# Patient Record
Sex: Male | Born: 1990
Health system: Southern US, Community
[De-identification: ages and names within clinical notes are randomized; demographics above are authoritative.]

## PROBLEM LIST (undated history)

## (undated) DIAGNOSIS — J45909 Unspecified asthma, uncomplicated: Secondary | ICD-10-CM

## (undated) DIAGNOSIS — F909 Attention-deficit hyperactivity disorder, unspecified type: Secondary | ICD-10-CM

## (undated) DIAGNOSIS — T7840XA Allergy, unspecified, initial encounter: Secondary | ICD-10-CM

## (undated) DIAGNOSIS — D6851 Activated protein C resistance: Secondary | ICD-10-CM

## (undated) DIAGNOSIS — F32A Depression, unspecified: Secondary | ICD-10-CM

## (undated) DIAGNOSIS — F329 Major depressive disorder, single episode, unspecified: Secondary | ICD-10-CM

## (undated) DIAGNOSIS — F419 Anxiety disorder, unspecified: Secondary | ICD-10-CM

## (undated) HISTORY — DX: Depression, unspecified: F32.A

## (undated) HISTORY — DX: Unspecified asthma, uncomplicated: J45.909

## (undated) HISTORY — DX: Attention-deficit hyperactivity disorder, unspecified type: F90.9

## (undated) HISTORY — DX: Activated protein C resistance: D68.51

## (undated) HISTORY — DX: Major depressive disorder, single episode, unspecified: F32.9

## (undated) HISTORY — DX: Anxiety disorder, unspecified: F41.9

## (undated) HISTORY — PX: OTHER SURGICAL HISTORY: SHX169

## (undated) HISTORY — PX: COLONOSCOPY: SHX174

## (undated) HISTORY — DX: Allergy, unspecified, initial encounter: T78.40XA

---
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
## 2011-10-06 ENCOUNTER — Ambulatory Visit (INDEPENDENT_AMBULATORY_CARE_PROVIDER_SITE_OTHER): Payer: BC Managed Care – PPO

## 2011-10-06 DIAGNOSIS — F411 Generalized anxiety disorder: Secondary | ICD-10-CM

## 2011-10-06 DIAGNOSIS — F988 Other specified behavioral and emotional disorders with onset usually occurring in childhood and adolescence: Secondary | ICD-10-CM

## 2011-12-08 ENCOUNTER — Other Ambulatory Visit: Payer: Self-pay

## 2011-12-08 NOTE — Telephone Encounter (Signed)
Ritalin refill  Call mom [phone]

## 2011-12-09 MED ORDER — METHYLPHENIDATE HCL 20 MG PO TABS: 20.0000 mg | ORAL_TABLET | Freq: Three times a day (TID) | ORAL | Status: DC

## 2011-12-09 NOTE — Telephone Encounter (Signed)
Pt last seen for this 10/06/11. Dr Laney Pastor wrote x 2 at [facility]

## 2011-12-10 NOTE — Telephone Encounter (Signed)
Rx was written by Chelle and p/up by pt

## 2012-01-19 ENCOUNTER — Telehealth: Payer: Self-pay

## 2012-01-19 MED ORDER — METHYLPHENIDATE HCL 20 MG PO TABS: 20.0000 mg | ORAL_TABLET | Freq: Three times a day (TID) | ORAL | Status: DC

## 2012-01-19 NOTE — Telephone Encounter (Signed)
Done. Ready for pick up.  Benjamin [name]

## 2012-01-19 NOTE — Telephone Encounter (Signed)
PTS MOTHER CALLED TO REQUEST PTS RITALIN REFILLED  BEST: [phone]

## 2012-01-20 NOTE — Telephone Encounter (Signed)
LMOM that req ready for p/up

## 2012-02-16 ENCOUNTER — Telehealth: Payer: Self-pay

## 2012-02-16 NOTE — Telephone Encounter (Signed)
NEEDS RIDALIN REFILL FOR SON [place], [phone]

## 2012-02-17 MED ORDER — METHYLPHENIDATE HCL 20 MG PO TABS: 20.0000 mg | ORAL_TABLET | Freq: Three times a day (TID) | ORAL | Status: DC

## 2012-02-17 NOTE — Telephone Encounter (Signed)
LMOM THAT RX IS READY FOR PICKUP

## 2012-02-17 NOTE — Telephone Encounter (Signed)
CHART IS AT NURSES STATION FOR REVIEW  [id number]

## 2012-02-17 NOTE — Telephone Encounter (Signed)
Done and printed

## 2012-04-12 ENCOUNTER — Telehealth: Payer: Self-pay

## 2012-04-12 MED ORDER — METHYLPHENIDATE HCL 20 MG PO TABS: 20.0000 mg | ORAL_TABLET | Freq: Three times a day (TID) | ORAL | Status: DC

## 2012-04-12 NOTE — Telephone Encounter (Signed)
[place] IN NEED OF HIS RITALIN. PLEASE CALL [phone]

## 2012-04-12 NOTE — Telephone Encounter (Signed)
Chart is pulled and at the nurses station in the phone message pile.

## 2012-04-12 NOTE — Telephone Encounter (Signed)
Rx done and ready for pickup. Will need office visit for further refills.

## 2012-04-12 NOTE — Telephone Encounter (Signed)
Please pull paper chart.

## 2012-04-13 NOTE — Telephone Encounter (Signed)
Notified mother that Rx is ready and pt needs OV for add'l RFs. Mother agreed.

## 2012-05-25 ENCOUNTER — Ambulatory Visit (INDEPENDENT_AMBULATORY_CARE_PROVIDER_SITE_OTHER): Payer: BC Managed Care – PPO | Admitting: Internal Medicine

## 2012-05-25 VITALS — BP 122/86 | HR 82 | Temp 98.3°F | Resp 14 | Ht 69.5 in | Wt 172.0 lb

## 2012-05-25 DIAGNOSIS — F988 Other specified behavioral and emotional disorders with onset usually occurring in childhood and adolescence: Secondary | ICD-10-CM | POA: Insufficient documentation

## 2012-05-25 MED ORDER — METHYLPHENIDATE HCL 20 MG PO TABS: 20.0000 mg | ORAL_TABLET | Freq: Three times a day (TID) | ORAL | Status: DC

## 2012-05-25 MED ORDER — ALBUTEROL SULFATE HFA 108 (90 BASE) MCG/ACT IN AERS: 2.0000 | INHALATION_SPRAY | Freq: Four times a day (QID) | RESPIRATORY_TRACT | Status: DC | PRN

## 2012-05-25 NOTE — Progress Notes (Addendum)
  Subjective:    Patient ID: Benjamin [name], male    DOB: [birth year], 21 y.o.   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 This next year which is their first year of the team/Division I program/very difficult to find time for academics Has been there all summer taking courses Training at this level as produced significant weight gain and muscle without loss of speed He has no current problems and would like to use Ritalin again for this year/has been off for the summer  Majors political science/he hopes to work in the [employer]/plans to take [place]   Review of Systems Negative for any new symptoms/During the year he was identified as having exercise-induced bronchospasm He was treated with dulera and proair for breakthrough problems Successfully    Objective:   Physical Exam Vital signs stable Pupils equal round reactive to light and accommodation Heart regular without murmur or click Neurological intact Psychiatric stable       Assessment & Plan:  Problem #1 attention deficit disorder Meds ordered this encounter  Medications  . albuterol (PROVENTIL HFA;VENTOLIN HFA) 108 (90 BASE) MCG/ACT inhaler    Sig: Inhale 2 puffs into the lungs every 6 (six) hours as needed for wheezing.    Dispense:  1 Inhaler    Refill:  0  . methylphenidate (RITALIN) 20 MG tablet    Sig: Take 1 tablet (20 mg total) by mouth 3 (three) times daily.    Dispense:  90 tablet    Refill:  0  . methylphenidate (RITALIN) 20 MG tablet    Sig: Take 1 tablet (20 mg total) by mouth 3 (three) times daily. For 05/25/12 or after    Dispense:  90 tablet    Refill:  0  . methylphenidate (RITALIN) 20 MG tablet      Sig: Take 1 tablet (20 mg total) by mouth 3 (three) times daily. For 07/25/12    Dispense:  90 tablet    Refill:  0  May call in 3 months for another 3 prescriptions and followup in 6 months

## 2012-06-03 ENCOUNTER — Other Ambulatory Visit: Payer: Self-pay | Admitting: Internal Medicine

## 2012-06-03 NOTE — Telephone Encounter (Signed)
Deny, rx just refilled on 05/25/2012

## 2012-06-11 ENCOUNTER — Telehealth: Payer: Self-pay

## 2012-06-11 MED ORDER — ALBUTEROL SULFATE HFA 108 (90 BASE) MCG/ACT IN AERS: 2.0000 | INHALATION_SPRAY | Freq: Four times a day (QID) | RESPIRATORY_TRACT | Status: DC | PRN

## 2012-06-11 NOTE — Telephone Encounter (Signed)
Called in [place], mother notified

## 2012-06-11 NOTE — Telephone Encounter (Signed)
PATIENT'S MOTHER CALLED IN REGARD TO [name] INHALER.  DR. Laney Pastor HAD WRITTEN A PRESCRIPTION FOR ONE, BUT HE ENDED UP HAVING TO LEAVE THE INHALER WITH HIS COACH.  THEY WANT TO KNOW IF THEY CAN GET ANOTHER PRESCRIPTION FOR THE SAME INHALER SO THAT HE CAN HAVE ONE WITH HIM AT ALL TIMES.  COULD WE PLEASE CALL THIS INTO [facility], [place] - PHARMACY #.  [place] [phone]

## 2012-06-11 NOTE — Telephone Encounter (Signed)
Rx printed to be faxed to pharmacy.

## 2012-09-06 ENCOUNTER — Telehealth: Payer: Self-pay

## 2012-09-06 DIAGNOSIS — F988 Other specified behavioral and emotional disorders with onset usually occurring in childhood and adolescence: Secondary | ICD-10-CM

## 2012-09-06 NOTE — Telephone Encounter (Signed)
[place] IN NEED OF HIS RITALIN. PLEASE CALL [phone] WHEN READY

## 2012-09-06 NOTE — Telephone Encounter (Signed)
Patient due for follow up in Feb, have pended the Rx. Please advise.

## 2012-09-07 MED ORDER — METHYLPHENIDATE HCL 20 MG PO TABS: 20.0000 mg | ORAL_TABLET | Freq: Three times a day (TID) | ORAL | Status: DC

## 2012-09-07 NOTE — Telephone Encounter (Signed)
[place] for refills  Meds ordered this encounter  Medications  . methylphenidate (RITALIN) 20 MG tablet    Sig: Take 1 tablet (20 mg total) by mouth 3 (three) times daily. To be filled after 10/07/12    Dispense:  90 tablet    Refill:  0    Due for follow up in Feb 2014  . methylphenidate (RITALIN) 20 MG tablet    Sig: Take 1 tablet (20 mg total) by mouth 3 (three) times daily.    Dispense:  90 tablet    Refill:  0

## 2012-09-07 NOTE — Telephone Encounter (Signed)
Rx printed, but not signed will alert patient when Rx ready for pick up,

## 2012-09-20 ENCOUNTER — Telehealth: Payer: Self-pay

## 2012-09-20 NOTE — Telephone Encounter (Signed)
We can not call in Ritalin please advise what would be best course of action. Amy

## 2012-09-20 NOTE — Telephone Encounter (Signed)
MOTHER CALLED BACK AND SAID TO DISREGARD.  SHE FOUND A FRIEND WHO WILL BE TRAVELING TO THAT AREA, SO SHE IS GOING TO SEND THE MEDICINE BY THEM.

## 2012-09-20 NOTE — Telephone Encounter (Signed)
PT'S MOTHER CALLED - PT OF [employer] - HE TAKES RITALIN AND BECAUSE HE HAS HAD PROBLEMS WITH STUDENTS STEALING IT AT [employer], SHE HAS BEEN SENDING HIM A WEEK'S SUPPLY AT A TIME BY MAIL.  SHE SAYS THE LAST PACKAGE WAS LOST IN THE MAIL AND THAT HE IS TAKING EXAMS THIS WEEK.  HIS OTHER SCRIPT CAN NOT BE FILLED UNTIL January.  MOTHER WANTS TO KNOW IF WE CAN CALL IN 6 PILLS, JUST ENOUGH TO GET HIM THROUGH EXAMS.  HE WILL USE THE [facility] AT [facility] IN [place], [place] NUMBER IS [phone]

## 2012-09-27 ENCOUNTER — Ambulatory Visit: Payer: BC Managed Care – PPO | Admitting: Physician Assistant

## 2012-10-11 ENCOUNTER — Telehealth: Payer: Self-pay

## 2012-10-11 NOTE — Telephone Encounter (Signed)
PT IN NEED OF HIS RITALIN. PLEASE CALL [phone] WHEN READY

## 2012-10-12 NOTE — Telephone Encounter (Signed)
According to the chart he has a prescription written for January for Ritalin/please call to see what the story is

## 2012-10-12 NOTE — Telephone Encounter (Signed)
Left message for him to call me back.  

## 2012-10-15 NOTE — Telephone Encounter (Signed)
Patient has not called back, hopefully he has found his rx.

## 2012-10-20 ENCOUNTER — Other Ambulatory Visit: Payer: Self-pay | Admitting: Internal Medicine

## 2012-11-16 ENCOUNTER — Telehealth: Payer: Self-pay

## 2012-11-16 DIAGNOSIS — F988 Other specified behavioral and emotional disorders with onset usually occurring in childhood and adolescence: Secondary | ICD-10-CM

## 2012-11-16 MED ORDER — METHYLPHENIDATE HCL 20 MG PO TABS: 20.0000 mg | ORAL_TABLET | Freq: Three times a day (TID) | ORAL | Status: DC

## 2012-11-16 NOTE — Telephone Encounter (Signed)
PT IN NEED OF HIS RITALIN. PLEASE CALL [phone] WHEN READY FOR P/U

## 2012-11-16 NOTE — Telephone Encounter (Signed)
Is stable F/u after semester Meds ordered this encounter  Medications  . methylphenidate (RITALIN) 20 MG tablet    Sig: Take 1 tablet (20 mg total) by mouth 3 (three) times daily.    Dispense:  90 tablet    Refill:  0  . methylphenidate (RITALIN) 20 MG tablet    Sig: Take 1 tablet (20 mg total) by mouth 3 (three) times daily. To be filled after 01/14/13    Dispense:  90 tablet    Refill:  0  . methylphenidate (RITALIN) 20 MG tablet    Sig: Take 1 tablet (20 mg total) by mouth 3 (three) times daily. For 12/14/12 or after    Dispense:  90 tablet    Refill:  0

## 2012-11-16 NOTE — Telephone Encounter (Signed)
What is his follow up plan? He is due for office visit. I spoke to his mother and he probably will not be able to come in until May, when he is done with classes.

## 2012-11-17 NOTE — Telephone Encounter (Signed)
Called patients mother to advise Rx ready for pick up

## 2013-02-20 ENCOUNTER — Ambulatory Visit: Payer: BC Managed Care – PPO | Admitting: Internal Medicine

## 2013-02-20 VITALS — BP 120/76 | HR 72 | Temp 98.2°F | Resp 16 | Ht 69.5 in | Wt 170.0 lb

## 2013-02-20 DIAGNOSIS — F411 Generalized anxiety disorder: Secondary | ICD-10-CM | POA: Insufficient documentation

## 2013-02-20 DIAGNOSIS — F988 Other specified behavioral and emotional disorders with onset usually occurring in childhood and adolescence: Secondary | ICD-10-CM

## 2013-02-20 MED ORDER — METHYLPHENIDATE HCL 20 MG PO TABS: 20.0000 mg | ORAL_TABLET | Freq: Three times a day (TID) | ORAL | Status: DC

## 2013-02-20 MED ORDER — SERTRALINE HCL 100 MG PO TABS: 100.0000 mg | ORAL_TABLET | Freq: Every day | ORAL | Status: DC

## 2013-02-20 NOTE — Progress Notes (Signed)
  Subjective:    Patient ID: Benjamin [name], male    DOB: 1991/01/01, 22 y.o.   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 Grades were a struggle/academic probation at 1 point but survived to pass Restarted Celexa/Lexapro in December or January for mild depression and the anxiety Anxiety often despite this/at least 2 panic attacks/most anxiety centered around academics No trouble with sleep Jerlyn Ly counseling since exams Mother with strong history of anxiety disorder Much better now that school is out Will attend summer school however  Ritalin still working well without exacerbating anxiety   Review of Systems Had lots of trouble with asthma especially in the colder months/has been working with an Horticulturist, commercial in the sports medicine team physician in [place]/currently on dulera plus proair      Objective:   Physical Exam BP 120/76  Pulse 72  Temp(Src) 98.2 F (36.8 C) (Oral)  Resp 16  Ht 5' 9.5" (1.765 m)  Wt 170 lb (77.111 kg)  BMI 24.75 kg/m2 HEENT clear Heart regular Neurological intact Mood stable/affect appropriate       Assessment & Plan:  ADD (attention deficit disorder) - Plan:methylphenidate (RITALIN) 20 MG tablet tid x 3  May call in 3 months for another three-month refill  Anxiety state, unspecified - Plan: sertraline (ZOLOFT) 100 MG tablet  2 call in one month with a progress report so we can adjust his medication by phone  Overcoming anxiety for Dummies  Continue counseling

## 2013-03-17 ENCOUNTER — Telehealth: Payer: Self-pay

## 2013-03-17 NOTE — Telephone Encounter (Signed)
Dr.Doolittle, Pt's mother is dropping off a form for you to complete, the form is located in your box. [phone]

## 2013-03-19 NOTE — Telephone Encounter (Signed)
Please send message back when completed.

## 2013-03-22 NOTE — Telephone Encounter (Signed)
Please call mom I can't find this form /think I lost it She could have the [employer] compliance officer for his school send [place] another request or she could send me another copy of the form if she has it

## 2013-03-23 NOTE — Telephone Encounter (Signed)
Was able to find one online, it is in your box. Sign when you are able. Amy

## 2013-03-25 ENCOUNTER — Encounter: Payer: Self-pay | Admitting: Internal Medicine

## 2013-05-20 ENCOUNTER — Ambulatory Visit: Payer: BC Managed Care – PPO | Admitting: Internal Medicine

## 2013-05-20 VITALS — BP 120/70 | HR 91 | Temp 97.5°F | Resp 18 | Ht 70.0 in | Wt 182.0 lb

## 2013-05-20 DIAGNOSIS — L03119 Cellulitis of unspecified part of limb: Secondary | ICD-10-CM

## 2013-05-20 DIAGNOSIS — L02619 Cutaneous abscess of unspecified foot: Secondary | ICD-10-CM

## 2013-05-20 DIAGNOSIS — G4700 Insomnia, unspecified: Secondary | ICD-10-CM

## 2013-05-20 DIAGNOSIS — F411 Generalized anxiety disorder: Secondary | ICD-10-CM

## 2013-05-20 MED ORDER — DOXYCYCLINE HYCLATE 100 MG PO TABS: 100.0000 mg | ORAL_TABLET | Freq: Two times a day (BID) | ORAL | Status: DC

## 2013-05-20 MED ORDER — CLONAZEPAM 0.5 MG PO TABS: 0.5000 mg | ORAL_TABLET | Freq: Every evening | ORAL | Status: DC | PRN

## 2013-05-20 MED ORDER — SERTRALINE HCL 100 MG PO TABS: 150.0000 mg | ORAL_TABLET | Freq: Every day | ORAL | Status: DC

## 2013-05-20 NOTE — Progress Notes (Signed)
  Subjective:    Patient ID: Benjamin [name], male    DOB: 25-May-1991, 22 y.o.   MRN: [id number]  HPI   3-4hrs to fall asleep then wakes early-- Dr Edison Pace---   Review of Systems     Objective:   Physical Exam        Assessment & Plan:

## 2013-07-26 ENCOUNTER — Ambulatory Visit: Payer: BC Managed Care – PPO | Admitting: Internal Medicine

## 2013-07-26 VITALS — BP 116/72 | HR 77 | Temp 97.9°F | Resp 16 | Ht 70.0 in | Wt 190.0 lb

## 2013-07-26 DIAGNOSIS — F411 Generalized anxiety disorder: Secondary | ICD-10-CM

## 2013-07-26 DIAGNOSIS — F988 Other specified behavioral and emotional disorders with onset usually occurring in childhood and adolescence: Secondary | ICD-10-CM

## 2013-07-26 MED ORDER — METHYLPHENIDATE HCL 20 MG PO TABS: 20.0000 mg | ORAL_TABLET | Freq: Three times a day (TID) | ORAL | Status: DC

## 2013-07-26 MED ORDER — SERTRALINE HCL 100 MG PO TABS: 150.0000 mg | ORAL_TABLET | Freq: Every day | ORAL | Status: DC

## 2013-07-26 NOTE — Progress Notes (Signed)
Subjective:    Patient ID: Benjamin [name], male    DOB: 21-May-1991, 21 y.o.   MRN: [id number]  HPI This chart was scribed for [facility],  by Lovena Le Day, Scribe. This patient was seen in room 1 and the patient's care was started at 4:14 PM.  HPI Comments: Benjamin [name] is a 22 y.o. male who presents to the [facility] for a medication update/refill of ritalin. He reports no side affects from his ritalin medicine or zoloft medicine and states has not needed to use needed to use sleeping medicine recently. He states has had no recent illnesses. He states does not feel any need to change the plan for his current medicines. He has been using half dosage of his ritalin before working out or going to volunteer and states that he usually takes x1 per day to help him stay organized and focused.  He states his relationship at home with his parents has been well and have not had any major conflicts recently. He states that recently he has been volunteering often and working out everyday. He is planning to take classes in town this school year at [employer] and next fall start classes at [employer]. And play lacrosse  Past Medical History  Diagnosis Date  . Anxiety   . Depression   . Allergy     -  ADD  History reviewed. No pertinent past surgical history.  Family History  Problem Relation Age of Onset  . Hypertension Mother   . Hypertension Father     History   Social History  . Marital Status: Single    Spouse Name: N/A    Number of Children: N/A  . Years of Education: N/A   Occupational History  . Not on file.   Social History Main Topics  . Smoking status: Never Smoker   . Smokeless tobacco: Not on file  . Alcohol Use: Not on file  . Drug Use: Not on file  . Sexual Activity: Not on file   Other Topics Concern  . Not on file   Social History Narrative  . No narrative on file    Allergies  Allergen Reactions  . Penicillins     Patient  Active Problem List   Diagnosis Date Noted  . Generalized anxiety disorder 02/20/2013  . ADD (attention deficit disorder) 05/25/2012     Review of Systems  Constitutional: Negative for fever and chills.  Psychiatric/Behavioral: Negative for behavioral problems and sleep disturbance.   BP 116/72  Pulse 77  Temp(Src) 97.9 F (36.6 C) (Oral)  Resp 16  Ht 5' 10"  (1.778 m)  Wt 190 lb (86.183 kg)  BMI 27.26 kg/m2  SpO2 99%     Objective:   Physical Exam  Nursing note and vitals reviewed. Constitutional: He is oriented to person, place, and time. He appears well-developed and well-nourished. No distress.  HENT:  Head: Normocephalic and atraumatic.  Neck: Neck supple. No tracheal deviation present.  Cardiovascular: Normal rate.   Pulmonary/Chest: Effort normal. No respiratory distress.  Musculoskeletal: Normal range of motion.  Neurological: He is alert and oriented to person, place, and time.  Skin: Skin is warm and dry.  Psychiatric: He has a normal mood and affect. His behavior is normal.         Assessment & Plan:  ADD (attention deficit disorder) - Plan: methylphenidate (RITALIN) 20 MG tablet, methylphenidate (RITALIN) 20 MG tablet, methylphenidate (RITALIN) 20 MG tablet  Anxiety state, unspecified - Plan:  sertraline (ZOLOFT) 100 MG tablet  Meds ordered this encounter  Medications  . methylphenidate (RITALIN) 20 MG tablet    Sig: Take 1 tablet (20 mg total) by mouth 3 (three) times daily.    Dispense:  90 tablet    Refill:  0  . methylphenidate (RITALIN) 20 MG tablet    Sig: Take 1 tablet (20 mg total) by mouth 3 (three) times daily. After 10/06/13    Dispense:  90 tablet    Refill:  0  . methylphenidate (RITALIN) 20 MG tablet    Sig: Take 1 tablet (20 mg total) by mouth 3 (three) times daily. After 08/30/13    Dispense:  90 tablet    Refill:  0  . sertraline (ZOLOFT) 100 MG tablet    Sig: Take 1.5 tablets (150 mg total) by mouth daily. #135 3 refills     Dispense:  135    Refill:  3   May refill til transition to college in august

## 2013-09-26 ENCOUNTER — Telehealth: Payer: Self-pay

## 2013-09-26 NOTE — Telephone Encounter (Signed)
Pharmacy advised, okay to fill early. He did get this early last month also.

## 2013-09-26 NOTE — Telephone Encounter (Signed)
Pharmacy states patient is getting this early every month for the past 4 months. They are very concerned about early fills. I advised pharmacy I see no documentation he has gotten this early.

## 2013-09-26 NOTE — Telephone Encounter (Signed)
We should make sure next rx goes back in line with original 30 d interval

## 2013-09-26 NOTE — Telephone Encounter (Signed)
[phone]  Prescription for Ritalin is not to be filled until 10/06/13.  Patient is going out of town and would like it filled today..   [phone]

## 2013-09-26 NOTE — Telephone Encounter (Signed)
Is it okay for early refill due to holidays?

## 2013-09-26 NOTE — Telephone Encounter (Signed)
Called him, advised, called pharmacy advised.

## 2013-09-26 NOTE — Telephone Encounter (Signed)
Just fine

## 2013-09-27 NOTE — Telephone Encounter (Signed)
Thanks left message at pharmacy.

## 2013-10-29 ENCOUNTER — Ambulatory Visit: Payer: BC Managed Care – PPO | Admitting: Internal Medicine

## 2013-10-29 VITALS — BP 110/68 | HR 99 | Temp 98.0°F | Resp 16 | Ht 69.5 in | Wt 193.0 lb

## 2013-10-29 DIAGNOSIS — F988 Other specified behavioral and emotional disorders with onset usually occurring in childhood and adolescence: Secondary | ICD-10-CM

## 2013-10-29 MED ORDER — METHYLPHENIDATE HCL 20 MG PO TABS: 20.0000 mg | ORAL_TABLET | Freq: Three times a day (TID) | ORAL | Status: DC

## 2013-10-29 NOTE — Progress Notes (Signed)
   Subjective:    Patient ID: Benjamin [name], male    DOB: [birth year], 23 y.o.   MRN: [id number] This chart was scribed for Tami Lin, MD by Vernell Barrier, Medical Scribe. This patient's care was started at 9:55 AM.  HPI HPI Comments: Benjamin [name] is a 23 y.o. male who presents to the [facility] for a medication refill.  Patient Active Problem List   Diagnosis Date Noted  . Generalized anxiety disorder 02/20/2013  . ADD (attention deficit disorder) 05/25/2012   Pt is not back in school; states he has a lack of motivation to go back right now. Is currently working as a Physiological scientist at [employer]. States medications are working fine. Pt is still taking 150 mg Zoloft and 20 mg Ritalin. Pt reports no other recent issues or injury at today's visit. Still scheduled to return to [employer] next fall to play lacrosse Continues to respond well to Zoloft 150 mg daily Review of Systems Noncontributory Objective:  Physical Exam  Nursing note and vitals reviewed. Constitutional: He is oriented to person, place, and time. He appears well-developed and well-nourished. No distress.  HENT:  Head: Normocephalic and atraumatic.  Eyes: EOM are normal.  Neck: Neck supple. No tracheal deviation present.  Cardiovascular: Normal rate.   Pulmonary/Chest: Effort normal. No respiratory distress.  Musculoskeletal: Normal range of motion.  Neurological: He is alert and oriented to person, place, and time.  Skin: Skin is warm and dry.  Psychiatric: He has a normal mood and affect. His behavior is normal.   Assessment & Plan:  I have completed the patient encounter in its entirety as documented by the scribe, with editing by me where necessary. Adair Lemar P. Laney Pastor, M.D. ADD (attention deficit disorder) - Plan: methylphenidate (RITALIN) 20 MG tablet, methylphenidate (RITALIN) 20 MG tablet, methylphenidate (RITALIN) 20 MG tablet  generalized anxiety disorder Meds  ordered this encounter  Medications  . methylphenidate (RITALIN) 20 MG tablet    Sig: Take 1 tablet (20 mg total) by mouth 3 (three) times daily. For 60 days after signing date    Dispense:  90 tablet    Refill:  0  . methylphenidate (RITALIN) 20 MG tablet    Sig: Take 1 tablet (20 mg total) by mouth 3 (three) times daily.    Dispense:  90 tablet    Refill:  0  . methylphenidate (RITALIN) 20 MG tablet    Sig: Take 1 tablet (20 mg total) by mouth 3 (three) times daily. for 30 days after signing date    Dispense:  90 tablet    Refill:  0   has Zoloft Follow-through 6 months

## 2014-01-26 ENCOUNTER — Telehealth: Payer: Self-pay

## 2014-01-26 DIAGNOSIS — F988 Other specified behavioral and emotional disorders with onset usually occurring in childhood and adolescence: Secondary | ICD-10-CM

## 2014-01-26 NOTE — Telephone Encounter (Signed)
Patient called requesting a refill on Ritalin 20 mg [facility] [address].

## 2014-01-29 MED ORDER — METHYLPHENIDATE HCL 20 MG PO TABS: 20.0000 mg | ORAL_TABLET | Freq: Three times a day (TID) | ORAL | Status: DC

## 2014-01-29 NOTE — Telephone Encounter (Signed)
Meds ordered this encounter  Medications  . methylphenidate (RITALIN) 20 MG tablet    Sig: Take 1 tablet (20 mg total) by mouth 3 (three) times daily.    Dispense:  90 tablet    Refill:  0  . methylphenidate (RITALIN) 20 MG tablet    Sig: Take 1 tablet (20 mg total) by mouth 3 (three) times daily. For 60 days after signing date    Dispense:  90 tablet    Refill:  0  . methylphenidate (RITALIN) 20 MG tablet    Sig: Take 1 tablet (20 mg total) by mouth 3 (three) times daily. for 30 days after signing date    Dispense:  90 tablet    Refill:  0

## 2014-01-30 NOTE — Telephone Encounter (Signed)
LMVM that medication would be ready for pick up today after 4pm per Dr. Ninfa Meeker comment.

## 2014-05-02 ENCOUNTER — Telehealth: Payer: Self-pay

## 2014-05-02 DIAGNOSIS — F988 Other specified behavioral and emotional disorders with onset usually occurring in childhood and adolescence: Secondary | ICD-10-CM

## 2014-05-02 MED ORDER — METHYLPHENIDATE HCL 20 MG PO TABS: 20.0000 mg | ORAL_TABLET | Freq: Three times a day (TID) | ORAL | Status: DC

## 2014-05-02 NOTE — Telephone Encounter (Signed)
lmom for pt, rx ready for pick up at 102

## 2014-05-02 NOTE — Telephone Encounter (Signed)
Please advise 

## 2014-05-02 NOTE — Telephone Encounter (Signed)
Meds ordered this encounter  Medications  . methylphenidate (RITALIN) 20 MG tablet    Sig: Take 1 tablet (20 mg total) by mouth 3 (three) times daily. For 60 days after signing date    Dispense:  90 tablet    Refill:  0  . methylphenidate (RITALIN) 20 MG tablet    Sig: Take 1 tablet (20 mg total) by mouth 3 (three) times daily.    Dispense:  90 tablet    Refill:  0  . methylphenidate (RITALIN) 20 MG tablet    Sig: Take 1 tablet (20 mg total) by mouth 3 (three) times daily. for 30 days after signing date    Dispense:  90 tablet    Refill:  0

## 2014-05-02 NOTE — Telephone Encounter (Signed)
Patient is requesting methylphenidate (RITALIN) 20 MG tablet   [phone]

## 2014-06-06 ENCOUNTER — Telehealth: Payer: Self-pay

## 2014-06-06 DIAGNOSIS — F988 Other specified behavioral and emotional disorders with onset usually occurring in childhood and adolescence: Secondary | ICD-10-CM

## 2014-06-06 NOTE — Telephone Encounter (Signed)
Patient called and states he lost 2 of his prescriptions (20 mg ritalin). Patient wants to know if he can have new prescriptions. Please return call and advise. CB # [phone]

## 2014-06-07 MED ORDER — METHYLPHENIDATE HCL 20 MG PO TABS: 20.0000 mg | ORAL_TABLET | Freq: Three times a day (TID) | ORAL | Status: DC

## 2014-06-07 NOTE — Telephone Encounter (Signed)
Meds ordered this encounter  Medications  . methylphenidate (RITALIN) 20 MG tablet    Sig: Take 1 tablet (20 mg total) by mouth 3 (three) times daily.    Dispense:  90 tablet    Refill:  0  . methylphenidate (RITALIN) 20 MG tablet    Sig: Take 1 tablet (20 mg total) by mouth 3 (three) times daily. for 30 days after signing date    Dispense:  90 tablet    Refill:  0

## 2014-06-08 NOTE — Telephone Encounter (Signed)
Rx in Dr Laney Pastor box to be signed.

## 2014-06-09 NOTE — Telephone Encounter (Signed)
Lm to advise pt.

## 2014-08-28 ENCOUNTER — Telehealth: Payer: Self-pay

## 2014-08-28 NOTE — Telephone Encounter (Signed)
Pt requesting adderall refill for 3 mths   Best phone [phone]

## 2014-08-28 NOTE — Telephone Encounter (Signed)
Yes/needs f/u--?home from school Friday?? Or could add him on this Wednesday appt

## 2014-08-28 NOTE — Telephone Encounter (Signed)
It looks like pt takes Ritalin, not Adderall, but he has not been in for check up recently. Dr Laney Pastor do you want to give any RFs?

## 2014-08-29 NOTE — Telephone Encounter (Signed)
Notified pt of need for f/up and transferred to 104 to sch appt for tomorrow. He will not be in town on Fri.

## 2014-08-30 ENCOUNTER — Encounter: Payer: Self-pay | Admitting: Internal Medicine

## 2014-08-30 ENCOUNTER — Ambulatory Visit (INDEPENDENT_AMBULATORY_CARE_PROVIDER_SITE_OTHER): Payer: BC Managed Care – PPO | Admitting: Internal Medicine

## 2014-08-30 VITALS — BP 126/83 | HR 79 | Temp 98.0°F | Resp 16 | Ht 70.0 in | Wt 199.0 lb

## 2014-08-30 DIAGNOSIS — J45909 Unspecified asthma, uncomplicated: Secondary | ICD-10-CM

## 2014-08-30 DIAGNOSIS — F988 Other specified behavioral and emotional disorders with onset usually occurring in childhood and adolescence: Secondary | ICD-10-CM

## 2014-08-30 DIAGNOSIS — F411 Generalized anxiety disorder: Secondary | ICD-10-CM

## 2014-08-30 DIAGNOSIS — F909 Attention-deficit hyperactivity disorder, unspecified type: Secondary | ICD-10-CM

## 2014-08-30 MED ORDER — METHYLPHENIDATE HCL 20 MG PO TABS: 20.0000 mg | ORAL_TABLET | Freq: Three times a day (TID) | ORAL | Status: DC

## 2014-08-30 MED ORDER — ALBUTEROL SULFATE HFA 108 (90 BASE) MCG/ACT IN AERS: 2.0000 | INHALATION_SPRAY | Freq: Four times a day (QID) | RESPIRATORY_TRACT | Status: DC | PRN

## 2014-08-30 NOTE — Progress Notes (Signed)
Subjective:    Patient ID: Benjamin [name], male    DOB: 19-Feb-1991, 23 y.o.   MRN: [id number]  HPI Patient presents for medication refills of Ritalin and Albuterol. Has been well in the interval. Ritalin still working and well tolerated. 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 Only side effect is loss of appetite for 1 hr follow med administration, but returns and is able to eat.  Only taking 100 mg of Zoloft and works at that dose. No refills needed at this time.  Would like to have an inhaler on hand. No current sx, however, will be smoking the [place] for Thanksgiving and would like for just in case.  Plans on returning to school Spring 2016 at [employer] for exercise science.  Review of Systems  Constitutional: Positive for appetite change (secondary to medication). Negative for fever, diaphoresis, activity change and fatigue.  Respiratory: Negative for shortness of breath and wheezing.   Cardiovascular: Negative for chest pain and palpitations.  Gastrointestinal: Negative for abdominal pain.  Neurological: Negative for dizziness, numbness and headaches.  Psychiatric/Behavioral: Negative for sleep disturbance.       Objective:   Physical Exam  Constitutional: He is oriented to person, place, and time. He appears well-developed and well-nourished. No distress.  Blood pressure 126/83, pulse 79, temperature 98 F (36.7 C), resp. rate 16, height 5' 10"  (1.778 m), weight 199 lb (90.266 kg), SpO2 97 %.   HENT:  Head: Normocephalic and atraumatic.  Right Ear: External ear normal.  Left Ear: External ear normal.  Nose: Nose normal.  Mouth/Throat: Oropharynx is clear and moist. No oropharyngeal exudate.  Eyes: Conjunctivae are normal. Pupils are equal, round, and reactive to light. Right eye exhibits no discharge. Left eye exhibits no discharge. No scleral icterus.  Neck: Neck supple. No thyromegaly present.  Cardiovascular: Normal rate, regular rhythm and normal heart sounds.   Exam reveals no gallop and no friction rub.   No murmur heard. Pulmonary/Chest: Breath sounds normal. He has no wheezes. He has no rales.  Abdominal: Soft. Bowel sounds are normal. There is no tenderness.  Lymphadenopathy:    He has no cervical adenopathy.  Neurological: He is alert and oriented to person, place, and time.  Skin: Skin is warm and dry. No rash noted. He is not diaphoretic. No erythema. No pallor.       Assessment & Plan:  1. Asthma, chronic, unspecified asthma severity, uncomplicated Controlled. - albuterol (PROVENTIL HFA;VENTOLIN HFA) 108 (90 BASE) MCG/ACT inhaler; Inhale 2 puffs into the lungs every 6 (six) hours as needed for wheezing.  Dispense: 1 Inhaler; Refill: 3  2. ADD (attention deficit disorder) Controlled. Refill Ritalin 20 mg.  3. Generalized anxiety disorder Controlled. No longer taking Klonopin.   Alveta Heimlich PA-C  [facility] 08/30/2014 1:39 PM Meds ordered this encounter  Medications  . albuterol (PROVENTIL HFA;VENTOLIN HFA) 108 (90 BASE) MCG/ACT inhaler    Sig: Inhale 2 puffs into the lungs every 6 (six) hours as needed for wheezing.    Dispense:  1 Inhaler    Refill:  3    Order Specific Question:  Supervising Provider    Answer:  Wardell Honour [[id number]]  . methylphenidate (RITALIN) 20 MG tablet    Sig: Take 1 tablet (20 mg total) by mouth 3 (three) times daily. for 30 days after signing date    Dispense:  90 tablet    Refill:  0  . methylphenidate (RITALIN)  20 MG tablet    Sig: Take 1 tablet (20 mg total) by mouth 3 (three) times daily. For 60 days after signing date    Dispense:  90 tablet    Refill:  0  . methylphenidate (RITALIN) 20 MG tablet    Sig: Take 1 tablet (20 mg total) by mouth 3 (three) times daily.    Dispense:  90 tablet    Refill:  0   I have participated in the care of this patient with the Advanced Practice Provider and agree with Diagnosis and Plan as  documented. Robert P. Laney Pastor, M.D.

## 2015-03-21 ENCOUNTER — Other Ambulatory Visit: Payer: Self-pay

## 2015-03-21 DIAGNOSIS — F988 Other specified behavioral and emotional disorders with onset usually occurring in childhood and adolescence: Secondary | ICD-10-CM

## 2015-03-21 NOTE — Telephone Encounter (Signed)
Needs refills on methylphenidate (RITALIN) 20 MG tablet Wants to know when patient is due for an OV for refills - Still one year???  [phone]  Joaquim Lai (mom)

## 2015-03-23 MED ORDER — METHYLPHENIDATE HCL 20 MG PO TABS: 20.0000 mg | ORAL_TABLET | Freq: Three times a day (TID) | ORAL | Status: DC

## 2015-03-23 NOTE — Telephone Encounter (Signed)
Tishira not in until Tues. Can someone help?

## 2015-03-23 NOTE — Telephone Encounter (Signed)
Tishira R Brewington, PA-C saw him with me at 104---she can write for 2 months and I can se him in aug before fall semester

## 2015-03-23 NOTE — Telephone Encounter (Signed)
Filled and printed 2 months worth.

## 2015-03-26 NOTE — Telephone Encounter (Signed)
Notified mother Rxs ready and need for f/up in Aug.

## 2015-05-23 ENCOUNTER — Encounter: Payer: Self-pay | Admitting: Internal Medicine

## 2015-05-23 ENCOUNTER — Ambulatory Visit (INDEPENDENT_AMBULATORY_CARE_PROVIDER_SITE_OTHER): Payer: BLUE CROSS/BLUE SHIELD | Admitting: Internal Medicine

## 2015-05-23 VITALS — BP 135/86 | HR 73 | Temp 97.9°F | Resp 16 | Ht 70.0 in | Wt 199.6 lb

## 2015-05-23 DIAGNOSIS — F988 Other specified behavioral and emotional disorders with onset usually occurring in childhood and adolescence: Secondary | ICD-10-CM

## 2015-05-23 DIAGNOSIS — F909 Attention-deficit hyperactivity disorder, unspecified type: Secondary | ICD-10-CM | POA: Diagnosis not present

## 2015-05-23 MED ORDER — METHYLPHENIDATE HCL 20 MG PO TABS: 20.0000 mg | ORAL_TABLET | Freq: Three times a day (TID) | ORAL | Status: DC

## 2015-05-23 MED ORDER — METHYLPHENIDATE HCL 20 MG PO TABS
20.0000 mg | ORAL_TABLET | Freq: Three times a day (TID) | ORAL | Status: DC
Start: 2015-05-23 — End: 2015-08-06

## 2015-05-24 NOTE — Progress Notes (Signed)
   Subjective:    Patient ID: Benjamin [name], male    DOB: May 23, 1991, 24 y.o.   MRN: [id number]  HPI follow-up Patient Active Problem List   Diagnosis Date Noted  . ADD (attention deficit disorder) 05/25/2012    Priority: Medium  . Generalized anxiety disorder 02/20/2013   Outpatient Encounter Prescriptions in past  Medication Sig  . albuterol (PROVENTIL HFA;VENTOLIN HFA) 108 (90 BASE) MCG/ACT inhaler Inhale 2 puffs into the lungs every 6 (six) hours as needed for wheezing.  . clonazePAM (KLONOPIN) 0.5 MG tablet Take 1 tablet (0.5 mg total) by mouth at bedtime as needed for anxiety.  . methylphenidate (RITALIN) 20 MG tablet Take 1 tablet (20 mg total) by mouth 3 (three) times daily.  . sertraline (ZOLOFT) 100 MG tablet Take 1.5 tablets (150 mg total) by mouth daily. (Patient not taking: Reported on 05/23/2015)   No facility-administered encounter medications on file as of 05/23/2015.   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 He has stopped using Zoloft. He is stopped Klonopin. He has not required much Ritalin. He has been challenged by his parents divorce. Father cheated. He has been support from mother. He is about return to school at [employer] for general requirements. He has however uncertain of his future, what will make him happy. This was a problem for him with his attempts at college in past.    Review of Systems Noncontributory    Objective:   Physical Exam  Constitutional: He is oriented to person, place, and time. He appears well-developed and well-nourished. No distress.  HENT:  Head: Normocephalic and atraumatic.  Eyes: Pupils are equal, round, and reactive to light.  Neck: Normal range of motion.  Cardiovascular: Normal rate and regular rhythm.   Pulmonary/Chest: Effort normal. No respiratory distress.  Musculoskeletal: Normal range of motion.  Neurological: He is  alert and oriented to person, place, and time.  Skin: Skin is warm and dry.  Psychiatric: He has a normal mood and affect. His behavior is normal.  Nursing note and vitals reviewed.  BP 135/86 mmHg  Pulse 73  Temp(Src) 97.9 F (36.6 C) (Oral)  Resp 16  Ht 5' 10"  (1.778 m)  Wt 199 lb 9.6 oz (90.538 kg)  BMI 28.64 kg/m2  SpO2 100%        Assessment & Plan:  ADD Adolescent adjustment reaction prolonged  We had a long discussion about finding purpose in life, and about alternatives to school as a way to explore his interests. If he resumes school he will need Ritalin restrictions have been written. He can follow-up in 6 months. Meds ordered this encounter  Medications  . methylphenidate (RITALIN) 20 MG tablet    Sig: Take 1 tablet (20 mg total) by mouth 3 (three) times daily. for 30 days after signing date    Dispense:  90 tablet    Refill:  0  . methylphenidate (RITALIN) 20 MG tablet    Sig: Take 1 tablet (20 mg total) by mouth 3 (three) times daily.    Dispense:  90 tablet    Refill:  0  . methylphenidate (RITALIN) 20 MG tablet    Sig: Take 1 tablet (20 mg total) by mouth 3 (three) times daily. For 60 days after signing date    Dispense:  90 tablet    Refill:  0

## 2015-07-12 ENCOUNTER — Ambulatory Visit: Payer: Self-pay | Admitting: Family Medicine

## 2015-08-06 ENCOUNTER — Encounter: Payer: Self-pay | Admitting: Family Medicine

## 2015-08-06 ENCOUNTER — Ambulatory Visit (INDEPENDENT_AMBULATORY_CARE_PROVIDER_SITE_OTHER): Payer: BLUE CROSS/BLUE SHIELD | Admitting: Family Medicine

## 2015-08-06 VITALS — BP 134/86 | HR 90 | Temp 98.3°F | Wt 203.0 lb

## 2015-08-06 DIAGNOSIS — F909 Attention-deficit hyperactivity disorder, unspecified type: Secondary | ICD-10-CM | POA: Diagnosis not present

## 2015-08-06 DIAGNOSIS — F988 Other specified behavioral and emotional disorders with onset usually occurring in childhood and adolescence: Secondary | ICD-10-CM

## 2015-08-06 DIAGNOSIS — Z23 Encounter for immunization: Secondary | ICD-10-CM

## 2015-08-06 DIAGNOSIS — F411 Generalized anxiety disorder: Secondary | ICD-10-CM

## 2015-08-06 DIAGNOSIS — J45909 Unspecified asthma, uncomplicated: Secondary | ICD-10-CM | POA: Insufficient documentation

## 2015-08-06 DIAGNOSIS — J4520 Mild intermittent asthma, uncomplicated: Secondary | ICD-10-CM | POA: Diagnosis not present

## 2015-08-06 NOTE — Progress Notes (Addendum)
Benjamin Reddish, MD Phone: [phone]  Subjective:  Patient presents today to establish care. Chief complaint-noted.   See problem oriented charting  The following were reviewed and entered/updated in epic: Past Medical History  Diagnosis Date  . Anxiety   . Depression   . Allergy   . Asthma     only before sports   Patient Active Problem List   Diagnosis Date Noted  . Asthma   . Generalized anxiety disorder 02/20/2013  . ADD (attention deficit disorder) 05/25/2012   Past Surgical History  Procedure Laterality Date  . None      Family History  Problem Relation Age of Onset  . Hypertension Mother   . Hypertension Father   . Alcoholism Father   . Alcoholism Paternal Grandfather     Medications- reviewed and updated Current Outpatient Prescriptions  Medication Sig Dispense Refill  . albuterol (PROVENTIL HFA;VENTOLIN HFA) 108 (90 BASE) MCG/ACT inhaler Inhale 2 puffs into the lungs every 6 (six) hours as needed for wheezing. 1 Inhaler 3  . methylphenidate (RITALIN) 20 MG tablet Take 1 tablet (20 mg total) by mouth 3 (three) times daily. For 60 days after signing date 90 tablet 0   No current facility-administered medications for this visit.    Allergies-reviewed and updated Allergies  Allergen Reactions  . Penicillins     Social History   Social History  . Marital Status: Single    Spouse Name: N/A  . Number of Children: N/A  . Years of Education: N/A   Social History Main Topics  . Smoking status: Never Smoker   . Smokeless tobacco: None  . Alcohol Use: 8.4 oz/week    14 Standard drinks or equivalent per week  . Drug Use: No  . Sexual Activity: Not Asked   Other Topics Concern  . None   Social History Narrative   Single. Not dating.       Full time personal trainer at the club   Going back to [place] until GPA up then [employer] is the plan   Wants to study exercise science      Hobbies: a lot of working out- 7 days a week, avid Museum/gallery conservator, wants to play lacrosse again    ROS--See HPI below , otherwise full ROS was completed and negative except as noted above  Objective: BP 134/86 mmHg  Pulse 90  Temp(Src) 98.3 F (36.8 C)  Wt 203 lb (92.08 kg) Gen: NAD, resting comfortably HEENT: Mucous membranes are moist. Oropharynx normal. TM normal. Eyes: sclera and lids normal, PERRLA Neck: no thyromegaly, no cervical lymphadenopathy CV: RRR no murmurs rubs or gallops Lungs: CTAB no crackles, wheeze, rhonchi Abdomen: soft/nontender/nondistended/normal bowel sounds. No rebound or guarding.  No GU exam Ext: no edema Skin: warm, dry Neuro: 5/5 strength in upper and lower extremities, normal gait, normal reflexes Psych: slightly anxious appearing  Assessment/Plan:  Generalized anxiety disorder S:Stopped taking zoloft in July. Despite divorce of mom and Dad (father cheated) and rough divorce- symptoms have not worsened and patient in fact states they have been controlled. History of alcoholism- used to drink mor ein past but currently 2 a night or 14 a week. Wants to cut to once anight or out.  ROS- no SI/HI A/P: continue to monitor, warning signs given for immediate return. With family history of alcoholism- I agreed cutting alcohol out would be reasonable. Want to avoid treating depression with alcohol potentially.    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 When not competing at high levels- does fine without albuterol but has on hand. No weekly use of albuterol A/P: exercise induced per patient report so likely simply mild intermittent asthma. Continue prn albuterol.    ADD (attention deficit disorder) S:ADD diagnosed sophomore year in HS. Does not remember provider but has papers at home which he will drop off. Has been well controlled with Ritalin 36m TID.  A/P: Treated by Dr. DLaney Pastorfor 3 years- willing to give 3 months but will need records to be in this office  before next 3 months   Patient has had sex in past but was protected. He declines STD screening.  CPE with baseline labs within next 1-2 years  Return in 6 months from next Ritalin refill (he has some left but will call when running out)   Check in with patient about how things are going with dealing with divorce and how plans are forming for future- still planning [employer] then [employer]?  Return precautions advised.   Orders Placed This Encounter  Procedures  . Flu Vaccine QUAD 36+ mos IM  . Tdap vaccine greater than or equal to 7yo IM

## 2015-08-06 NOTE — Patient Instructions (Addendum)
Flu shot received today.  Please drop off a copy of the information where you were diagnosed with ADD  We will provide by phone your next refill when we have that then we will see you in 6 months  You can sign up for a physical in a year and we will get baseline bloodwork on you at that time  Great to meet you and look forward to working with you!

## 2015-08-07 NOTE — Assessment & Plan Note (Signed)
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 When not competing at high levels- does fine without albuterol but has on hand. No weekly use of albuterol A/P: exercise induced per patient report so likely simply mild intermittent asthma. Continue prn albuterol.

## 2015-08-07 NOTE — Assessment & Plan Note (Signed)
S:Stopped taking zoloft in July. Despite divorce of mom and Dad (father cheated) and rough divorce- symptoms have not worsened and patient in fact states they have been controlled. History of alcoholism- used to drink mor ein past but currently 2 a night or 14 a week. Wants to cut to once anight or out.  ROS- no SI/HI A/P: continue to monitor, warning signs given for immediate return. With family history of alcoholism- I agreed cutting alcohol out would be reasonable. Want to avoid treating depression with alcohol potentially.

## 2015-08-07 NOTE — Assessment & Plan Note (Signed)
S:ADD diagnosed sophomore year in HS. Does not remember provider but has papers at home which he will drop off. Has been well controlled with Ritalin 17m TID.  A/P: Treated by Dr. DLaney Pastorfor 3 years- willing to give 3 months but will need records to be in this office before next 3 months

## 2015-09-03 ENCOUNTER — Encounter: Payer: Self-pay | Admitting: Internal Medicine

## 2015-10-17 ENCOUNTER — Telehealth: Payer: Self-pay

## 2015-10-17 NOTE — Telephone Encounter (Signed)
Pt called and LM that his ritalin needs a PA for new ins. PA approved for ritalin 20 mg TID, case # [id number]. Pt notified and pharm faxed.

## 2015-10-18 NOTE — Telephone Encounter (Signed)
Approval date through 10/16/16.

## 2016-01-03 ENCOUNTER — Encounter: Payer: Self-pay | Admitting: Family Medicine

## 2016-01-03 ENCOUNTER — Ambulatory Visit (INDEPENDENT_AMBULATORY_CARE_PROVIDER_SITE_OTHER): Payer: 59 | Admitting: Family Medicine

## 2016-01-03 VITALS — BP 128/88 | HR 93 | Temp 97.8°F | Wt 200.0 lb

## 2016-01-03 DIAGNOSIS — F411 Generalized anxiety disorder: Secondary | ICD-10-CM | POA: Diagnosis not present

## 2016-01-03 DIAGNOSIS — F909 Attention-deficit hyperactivity disorder, unspecified type: Secondary | ICD-10-CM

## 2016-01-03 DIAGNOSIS — F988 Other specified behavioral and emotional disorders with onset usually occurring in childhood and adolescence: Secondary | ICD-10-CM

## 2016-01-03 MED ORDER — METHYLPHENIDATE HCL 20 MG PO TABS: 20.0000 mg | ORAL_TABLET | Freq: Three times a day (TID) | ORAL | Status: DC | PRN

## 2016-01-03 NOTE — Patient Instructions (Addendum)
We will provide by phone your next refill for ADD. Call us before you run out in 3 months.

## 2016-01-03 NOTE — Assessment & Plan Note (Signed)
S: Symptoms controlled on ritalin TID. He tries to take minimal efffective dose so at times is able to take 1/4th or hafl a pill, at other sneeds full pill. Went through 180 pills or less in last 6 months A/P: refilled medication as doing well. #180 total given- likely to last 6 months but discussed I am willing to refill especially if restarts school where he generally needs more. Initial BP was elevated but normal on repeat so no indication to stop ritalin.

## 2016-01-03 NOTE — Assessment & Plan Note (Signed)
Still struggling with parents divorce. Yet to talk to father. Still drinking about 2 drinks a night with no recent increase. Anxiety generally controlled- states far worse in school. Ritalin does not seem to worsen anxiety significantly but has in past- will monitor. Counseled patient during visit.

## 2016-01-03 NOTE — Progress Notes (Signed)
Garret Reddish, MD  Subjective:  Benjamin [name] is a 25 y.o. year old very pleasant male patient who presents for/with See problem oriented charting ROS- no palpitations or chest pain, no unintentional weight loss or nausea  Past Medical History-  Patient Active Problem List   Diagnosis Date Noted  . Asthma   . Generalized anxiety disorder 02/20/2013  . ADD (attention deficit disorder) 05/25/2012    Medications- reviewed and updated Current Outpatient Prescriptions  Medication Sig Dispense Refill  . methylphenidate (RITALIN) 20 MG tablet Take 1 tablet (20 mg total) by mouth 3 (three) times daily as needed. May fill today 90 tablet 0  . albuterol (PROVENTIL HFA;VENTOLIN HFA) 108 (90 BASE) MCG/ACT inhaler Inhale 2 puffs into the lungs every 6 (six) hours as needed for wheezing. 1 Inhaler 3   Objective: BP 128/88 mmHg  Pulse 93  Temp(Src) 97.8 F (36.6 C)  Wt 200 lb (90.719 kg) Gen: NAD, resting comfortably, slightly anxious appearing CV: RRR no murmurs rubs or gallops Lungs: CTAB no crackles, wheeze, rhonchi Ext: no edema Skin: warm, dry  Assessment/Plan:  ADD (attention deficit disorder) S: Symptoms controlled on ritalin TID. He tries to take minimal efffective dose so at times is able to take 1/4th or hafl a pill, at other sneeds full pill. Went through 180 pills or less in last 6 months A/P: refilled medication as doing well. #180 total given- likely to last 6 months but discussed I am willing to refill especially if restarts school where he generally needs more. Initial BP was elevated but normal on repeat so no indication to stop ritalin.    Generalized anxiety disorder Still struggling with parents divorce. Yet to talk to father. Still drinking about 2 drinks a night with no recent increase. Anxiety generally controlled- states far worse in school. Ritalin does not seem to worsen anxiety significantly but has in past- will monitor. Counseled patient during visit.     Return in about 6 months (around 07/05/2016) for physical. Return precautions advised.   Meds ordered this encounter  Medications  . methylphenidate (RITALIN) 20 MG tablet    Sig: Take 1 tablet (20 mg total) by mouth 3 (three) times daily as needed. May fill today    Dispense:  90 tablet    Refill:  0  . methylphenidate (RITALIN) 20 MG tablet    Sig: Take 1 tablet (20 mg total) by mouth 3 (three) times daily as needed. May fill in 1 month    Dispense:  90 tablet    Refill:  0   The duration of face-to-face time during this visit was 15 minutes. Greater than 50% of this time was spent in counseling, explanation of diagnosis, planning of further management, and/or coordination of care.

## 2016-05-14 ENCOUNTER — Encounter: Payer: Self-pay | Admitting: Family Medicine

## 2016-05-14 ENCOUNTER — Ambulatory Visit (INDEPENDENT_AMBULATORY_CARE_PROVIDER_SITE_OTHER): Payer: 59 | Admitting: Family Medicine

## 2016-05-14 DIAGNOSIS — F411 Generalized anxiety disorder: Secondary | ICD-10-CM

## 2016-05-14 MED ORDER — ESCITALOPRAM OXALATE 10 MG PO TABS: 10.0000 mg | ORAL_TABLET | Freq: Every day | ORAL | 5 refills | Status: DC

## 2016-05-14 NOTE — Patient Instructions (Signed)
Start lexapro 81m  Follow up 4-5 weeks  Continue ritalin- if you feel like worsening symptoms- may need to try just once a day for more important activities  Taking the medicine as directed and not missing any doses is one of the best things you can do to treat your depression.  Here are some things to keep in mind:  1) Side effects (stomach upset, some increased anxiety) may happen before you notice a benefit.  These side effects typically go away over time. 2) Changes to your dose of medicine or a change in medication all together is sometimes necessary 3) Most people need to be on medication at least 6-12 months 4) Many people will notice an improvement within two weeks but the full effect of the medication can take up to 4-6 weeks 5) Stopping the medication when you start feeling better often results in a return of symptoms 6) If you start having thoughts of hurting yourself or others after starting this medicine, call our office immediately at [phone] seek care through 911.

## 2016-05-14 NOTE — Assessment & Plan Note (Signed)
S: Patient states his anxiety/depression have both worsened while dealing with stress of parents divorce. PHQ9 of 9 with no SI/HI. GAD7 of 14. Has had some panic attacks where he feels frozen for about 10 minute and some shortness of breath with it. Thinking about moving to [place] to get a fresh start. Still ienjoying work. NO drastic increase in symptoms on ritalin- still using 1/4 to 1/2 about twice a day of the ritalin.  A/P: has been on celexa and zoloft in past- seems to want to try new direction so will try lexapro 66m with 4-5 week follow up. Precautions per avs. Likely refill add meds next visit

## 2016-05-14 NOTE — Progress Notes (Signed)
Subjective:  Benjamin [name] is a 25 y.o. year old very pleasant male patient who presents for/with See problem oriented charting ROS- no SI/HI. No chest pain or dizziness.see any ROS included in HPI as well.   Past Medical History-  Patient Active Problem List   Diagnosis Date Noted  . Asthma   . Generalized anxiety disorder 02/20/2013  . ADD (attention deficit disorder) 05/25/2012    Medications- reviewed and updated Current Outpatient Prescriptions  Medication Sig Dispense Refill  . methylphenidate (RITALIN) 20 MG tablet Take 1 tablet (20 mg total) by mouth 3 (three) times daily as needed. May fill today 90 tablet 0  . methylphenidate (RITALIN) 20 MG tablet Take 1 tablet (20 mg total) by mouth 3 (three) times daily as needed. May fill in 1 month 90 tablet 0  . albuterol (PROVENTIL HFA;VENTOLIN HFA) 108 (90 BASE) MCG/ACT inhaler Inhale 2 puffs into the lungs every 6 (six) hours as needed for wheezing. 1 Inhaler 3   No current facility-administered medications for this visit.     Objective: BP 108/82 (BP Location: Left Arm, Patient Position: Sitting, Cuff Size: Large)   Pulse 81   Temp 97.7 F (36.5 C) (Oral)   Wt 198 lb 6.4 oz (90 kg)   SpO2 96%   BMI 28.47 kg/m  Gen: NAD, more anxious appearing than normal CV: RRR no murmurs rubs or gallops Lungs: CTAB no crackles, wheeze, rhonchi Ext: no edema  Assessment/Plan:  Generalized anxiety disorder S: Patient states his anxiety/depression have both worsened while dealing with stress of parents divorce. PHQ9 of 9 with no SI/HI. GAD7 of 14. Has had some panic attacks where he feels frozen for about 10 minute and some shortness of breath with it. Thinking about moving to [place] to get a fresh start. Still ienjoying work. NO drastic increase in symptoms on ritalin- still using 1/4 to 1/2 about twice a day of the ritalin.  A/P: has been on celexa and zoloft in past- seems to want to try new direction so will try lexapro  63m with 4-5 week follow up. Precautions per avs. Likely refill add meds next visit  4-5 weeks.   Meds ordered this encounter  Medications  . escitalopram (LEXAPRO) 10 MG tablet    Sig: Take 1 tablet (10 mg total) by mouth daily.    Dispense:  30 tablet    Refill:  5   The duration of face-to-face time during this visit was 20 minutes. Greater than 50% of this time was spent in counseling, explanation of diagnosis, planning of further management, and/or coordination of care.    Return precautions advised.  SGarret Reddish MD

## 2016-05-14 NOTE — Progress Notes (Signed)
Pre visit review using our clinic review tool, if applicable. No additional management support is needed unless otherwise documented below in the visit note. 

## 2016-06-11 ENCOUNTER — Ambulatory Visit: Payer: 59 | Admitting: Family Medicine

## 2016-06-16 ENCOUNTER — Ambulatory Visit (INDEPENDENT_AMBULATORY_CARE_PROVIDER_SITE_OTHER): Payer: 59 | Admitting: Family Medicine

## 2016-06-16 ENCOUNTER — Encounter: Payer: Self-pay | Admitting: Family Medicine

## 2016-06-16 DIAGNOSIS — F411 Generalized anxiety disorder: Secondary | ICD-10-CM

## 2016-06-16 MED ORDER — METHYLPHENIDATE HCL 20 MG PO TABS: 20.0000 mg | ORAL_TABLET | Freq: Three times a day (TID) | ORAL | 0 refills | Status: DC | PRN

## 2016-06-16 NOTE — Progress Notes (Signed)
Pre visit review using our clinic review tool, if applicable. No additional management support is needed unless otherwise documented below in the visit note. 

## 2016-06-16 NOTE — Progress Notes (Signed)
Subjective:  Coree Aleksandar Duve is a 25 y.o. year old very pleasant male patient who presents for/with See problem oriented charting ROS- no si/hi. No chest pain or shortness of breath recently-asthma has been doing well.see any ROS included in HPI as well.   Past Medical History-  Patient Active Problem List   Diagnosis Date Noted  . Asthma   . Generalized anxiety disorder 02/20/2013  . ADD (attention deficit disorder) 05/25/2012    Medications- reviewed and updated Current Outpatient Prescriptions  Medication Sig Dispense Refill  . escitalopram (LEXAPRO) 10 MG tablet Take 1 tablet (10 mg total) by mouth daily. 30 tablet 5  . methylphenidate (RITALIN) 20 MG tablet Take 1 tablet (20 mg total) by mouth 3 (three) times daily as needed. May fill today 90 tablet 0  . methylphenidate (RITALIN) 20 MG tablet Take 1 tablet (20 mg total) by mouth 3 (three) times daily as needed. May fill in 1 month 90 tablet 0  . albuterol (PROVENTIL HFA;VENTOLIN HFA) 108 (90 BASE) MCG/ACT inhaler Inhale 2 puffs into the lungs every 6 (six) hours as needed for wheezing. 1 Inhaler 3   No current facility-administered medications for this visit.     Objective: BP 118/84   Pulse 78   Temp 98.1 F (36.7 C) (Oral)   Wt 199 lb 6.4 oz (90.4 kg)   SpO2 97%   BMI 28.61 kg/m  Gen: NAD, resting comfortably  Assessment/Plan:  Generalized anxiety disorder S: Prior GAD7 14 and PHQ9 of 9. Patient was supposed to start lexapro 25m after last visit and did but had intense anxiety at first. Tried again 2 weeks ago and did much better. Only noted mild improvement in anxiety with GAD7 of 13 but PHQ9 remains a 9. The key for him is his struggles with what he is going to do with his life as of now. Last saw counselor 2 years ago but has done his whole life it seems and doesn't really want to pursue this yet.  A/P: continue on 131mand follow up in 4 weeks by [employer] phone. If not significantly improved with both  scores under 5- which is likely- then titrate to 2048mGoing slow due to prior side effects of worsening anxiety. Even though patient does not want to see psychology right now- I did provide counseling about anxiety and life goals today.   also refilled ADD meds- patient has for most part been doing 1/3 to 1/2 tablet BID of ritalin but is looking at starting school back and may need prior higher doses Meds ordered this encounter  Medications  . methylphenidate (RITALIN) 20 MG tablet    Sig: Take 1 tablet (20 mg total) by mouth 3 (three) times daily as needed. May fill today    Dispense:  90 tablet    Refill:  0  . methylphenidate (RITALIN) 20 MG tablet    Sig: Take 1 tablet (20 mg total) by mouth 3 (three) times daily as needed. May fill in 1 month    Dispense:  90 tablet    Refill:  0   The duration of face-to-face time during this visit was greater than 25 minutes. Greater than 50% of this time was spent in counseling about anxiety and exploring life goals.   Return precautions advised.  SteGarret ReddishD

## 2016-06-16 NOTE — Assessment & Plan Note (Signed)
S: Prior GAD7 14 and PHQ9 of 9. Patient was supposed to start lexapro 55m after last visit and did but had intense anxiety at first. Tried again 2 weeks ago and did much better. Only noted mild improvement in anxiety with GAD7 of 13 but PHQ9 remains a 9. The key for him is his struggles with what he is going to do with his life as of now. Last saw counselor 2 years ago but has done his whole life it seems and doesn't really want to pursue this yet.  A/P: continue on 133mand follow up in 4 weeks by [employer] phone. If not significantly improved with both scores under 5- which is likely- then titrate to 2023mGoing slow due to prior side effects of worsening anxiety. Even though patient does not want to see psychology right now- I did provide counseling about anxiety and life goals today.

## 2016-06-16 NOTE — Patient Instructions (Signed)
In 4 weeks send me a mychart message with your PHQ9 and GAD7 score as well as a comment to verify no thoughts of hurting yourself if that remains true. Suspect we will go up to 10m lexapro at that time and then see each other 6 weeks from that point  Refilled ADD meds

## 2016-11-24 ENCOUNTER — Encounter: Payer: Self-pay | Admitting: Family Medicine

## 2016-11-24 ENCOUNTER — Ambulatory Visit (INDEPENDENT_AMBULATORY_CARE_PROVIDER_SITE_OTHER): Payer: 59 | Admitting: Family Medicine

## 2016-11-24 VITALS — BP 132/88 | HR 92 | Temp 97.9°F | Ht 70.0 in | Wt 208.0 lb

## 2016-11-24 DIAGNOSIS — Z832 Family history of diseases of the blood and blood-forming organs and certain disorders involving the immune mechanism: Secondary | ICD-10-CM | POA: Diagnosis not present

## 2016-11-24 DIAGNOSIS — Z23 Encounter for immunization: Secondary | ICD-10-CM | POA: Diagnosis not present

## 2016-11-24 DIAGNOSIS — F988 Other specified behavioral and emotional disorders with onset usually occurring in childhood and adolescence: Secondary | ICD-10-CM

## 2016-11-24 DIAGNOSIS — F411 Generalized anxiety disorder: Secondary | ICD-10-CM

## 2016-11-24 MED ORDER — ESCITALOPRAM OXALATE 20 MG PO TABS: 20.0000 mg | ORAL_TABLET | Freq: Every day | ORAL | 5 refills | Status: DC

## 2016-11-24 MED ORDER — METHYLPHENIDATE HCL 20 MG PO TABS: 20.0000 mg | ORAL_TABLET | Freq: Three times a day (TID) | ORAL | 0 refills | Status: DC | PRN

## 2016-11-24 NOTE — Assessment & Plan Note (Signed)
S: Patient states he is feeling slightly better with more time on the lexapro 19m but admits anxiety still main issue- now ready to consider 242mdosage.  GAD7 from 14 peak-->12 PHQ9 of 9 with no SI--> 9  Once again has tried celexa and zoloft in the past . Does not want to trial counseling still.   [facility] Family situation improving after divorce in parents- has decreased his stress A/P:  Titrate to 20106miven still poorly controlled anxiety. Follow up 8 weeks. No SI.

## 2016-11-24 NOTE — Progress Notes (Signed)
Subjective:  Benjamin [name] is a 26 y.o. year old very pleasant male patient who presents for/with See problem oriented charting ROS- No chest pain or shortness of breath. No headache or blurry vision. Admits to anxiety. Mild depressed mood. No SI.    Past Medical History-  Patient Active Problem List   Diagnosis Date Noted  . Asthma   . Generalized anxiety disorder 02/20/2013  . Attention deficit disorder (ADD) in adult 05/25/2012    Medications- reviewed and updated Current Outpatient Prescriptions  Medication Sig Dispense Refill  . escitalopram (LEXAPRO) 20 MG tablet Take 1 tablet (20 mg total) by mouth daily. 30 tablet 5  . methylphenidate (RITALIN) 20 MG tablet Take 1 tablet (20 mg total) by mouth 3 (three) times daily as needed. May fill today 90 tablet 0  . methylphenidate (RITALIN) 20 MG tablet Take 1 tablet (20 mg total) by mouth 3 (three) times daily as needed. May fill in 1 month 90 tablet 0  . albuterol (PROVENTIL HFA;VENTOLIN HFA) 108 (90 BASE) MCG/ACT inhaler Inhale 2 puffs into the lungs every 6 (six) hours as needed for wheezing. 1 Inhaler 3  . methylphenidate (RITALIN) 20 MG tablet Take 1 tablet (20 mg total) by mouth 3 (three) times daily as needed (may fill in 2 months). 90 tablet 0   No current facility-administered medications for this visit.     Objective: BP 132/88   Pulse 92   Temp 97.9 F (36.6 C) (Oral)   Ht 5' 10"  (1.778 m)   Wt 208 lb (94.3 kg)   SpO2 97%   BMI 29.84 kg/m  Gen: NAD, resting comfortably CV: RRR no murmurs rubs or gallops Lungs: CTAB no crackles, wheeze, rhonchi  Ext: no edema Skin: warm, dry Anxious appearing at times  BP high normal on diastolic- will monitor BP Readings from Last 3 Encounters:  11/24/16 132/88  06/16/16 118/84  05/14/16 108/82   Assessment/Plan:  FH: factor V Leiden mutation (mother with history of this and on lifelong bloodthinner. Sister also has. Results to Dr. Beryle Beams) - Plan: Factor 5  leiden, Prothrombin Gene Mutation  Generalized anxiety disorder S: Patient states he is feeling slightly better with more time on the lexapro 71m but admits anxiety still main issue- now ready to consider 211mdosage.  GAD7 from 14 peak-->12 PHQ9 of 9 with no SI--> 9  Once again has tried celexa and zoloft in the past . Does not want to trial counseling still.   [facility]. Family situation improving after divorce in parents- has decreased his stress A/P:  Titrate to 20110miven still poorly controlled anxiety. Follow up 8 weeks. No SI.   Attention deficit disorder (ADD) in adult S: well controlled- last filled September. Sometimes uses only 1/2 or 1/3rd of pill. 2 rx in September appeared to have last up to 6 months.  A/P: refilled ritalin 73m89mr TID usage at maximum- may need full dose when restarts school  8 weeks  Orders Placed This Encounter  Procedures  . Flu Vaccine QUAD 36+ mos IM  . Factor 5 leiden  . Prothrombin Gene Mutation    Meds ordered this encounter  Medications  . escitalopram (LEXAPRO) 20 MG tablet    Sig: Take 1 tablet (20 mg total) by mouth daily.    Dispense:  30 tablet    Refill:  5  . methylphenidate (RITALIN) 20 MG tablet    Sig: Take 1 tablet (20 mg total) by  mouth 3 (three) times daily as needed (may fill in 2 months).    Dispense:  90 tablet    Refill:  0  . methylphenidate (RITALIN) 20 MG tablet    Sig: Take 1 tablet (20 mg total) by mouth 3 (three) times daily as needed. May fill today    Dispense:  90 tablet    Refill:  0  . methylphenidate (RITALIN) 20 MG tablet    Sig: Take 1 tablet (20 mg total) by mouth 3 (three) times daily as needed. May fill in 1 month    Dispense:  90 tablet    Refill:  0   The duration of face-to-face time during this visit was greater than 25 minutes. 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    Return precautions advised.  Garret Reddish, MD

## 2016-11-24 NOTE — Assessment & Plan Note (Signed)
S: well controlled- last filled September. Sometimes uses only 1/2 or 1/3rd of pill. 2 rx in September appeared to have last up to 6 months.  A/P: refilled ritalin 51m for TID usage at maximum- may need full dose when restarts school

## 2016-11-24 NOTE — Progress Notes (Signed)
Pre visit review using our clinic review tool, if applicable. No additional management support is needed unless otherwise documented below in the visit note. 

## 2016-11-24 NOTE — Patient Instructions (Signed)
Please stop by lab before you go  Increase lexapro to 32m. Follow up 8 weeks or so. Another medication called buspar may help with anxiety and we could consider that as next step if not quite there.

## 2016-11-28 ENCOUNTER — Encounter: Payer: Self-pay | Admitting: Family Medicine

## 2016-11-28 DIAGNOSIS — D6851 Activated protein C resistance: Secondary | ICD-10-CM | POA: Insufficient documentation

## 2016-11-28 LAB — FACTOR 5 LEIDEN

## 2016-11-28 LAB — PROTHROMBIN GENE MUTATION

## 2017-01-22 ENCOUNTER — Ambulatory Visit: Payer: 59 | Admitting: Family Medicine

## 2017-07-01 DIAGNOSIS — H04123 Dry eye syndrome of bilateral lacrimal glands: Secondary | ICD-10-CM | POA: Diagnosis not present

## 2017-07-20 ENCOUNTER — Encounter: Payer: Self-pay | Admitting: Family Medicine

## 2017-07-20 ENCOUNTER — Ambulatory Visit (INDEPENDENT_AMBULATORY_CARE_PROVIDER_SITE_OTHER): Payer: 59 | Admitting: Family Medicine

## 2017-07-20 VITALS — BP 114/80 | HR 80 | Temp 97.8°F | Ht 70.0 in | Wt 200.8 lb

## 2017-07-20 DIAGNOSIS — D6851 Activated protein C resistance: Secondary | ICD-10-CM | POA: Diagnosis not present

## 2017-07-20 DIAGNOSIS — Z23 Encounter for immunization: Secondary | ICD-10-CM | POA: Diagnosis not present

## 2017-07-20 DIAGNOSIS — F411 Generalized anxiety disorder: Secondary | ICD-10-CM

## 2017-07-20 DIAGNOSIS — F988 Other specified behavioral and emotional disorders with onset usually occurring in childhood and adolescence: Secondary | ICD-10-CM

## 2017-07-20 MED ORDER — METHYLPHENIDATE HCL 20 MG PO TABS: 20.0000 mg | ORAL_TABLET | Freq: Three times a day (TID) | ORAL | 0 refills | Status: DC | PRN

## 2017-07-20 NOTE — Assessment & Plan Note (Signed)
S: patient compliant with 40m lexapro. On 222m trialed for a few months and just felt mentally slowed- did not tolerate it and low energy- improved on half the dose. Still has sleep issues even without ADD medicines.  GAD7 today 13. Peak was 14.  PHQ9 today 8. No SI. Peak was 9.   celexa and zoloft in the past not helpful. Declines zoloft.   Had been to counselor 4 years ago- starting to think about going again.  A/P: poor control of both GAD and depression today. States ADD medicine does not worsen anxiety or sleep issues- will continue for now. Given lack of improvement despite multiple trials- discussed options of another trial medication like prozac or SNRI but transitions have been very useful- he prefers not to trial again.   Declines counseling again- starting to feel more open to this though and may reach out to his counselor from years ago.  Discussed psychiatry consult and he agrees- printed some options from [email or web address] and also discussed reaching out to his insurance

## 2017-07-20 NOTE — Patient Instructions (Addendum)
Refilled ADD meds. Considering anxiety not worse on these medications ok to refill   Please let us know immediately if any shortness of breath or leg swelling  Glad you are doing well. Hopefully we can get you feeling even better. Check out some of these options for psychiatry

## 2017-07-20 NOTE — Progress Notes (Signed)
Subjective:  Benjamin [name] is a 26 y.o. year old very pleasant male patient who presents for/with See problem oriented charting ROS- admits to anxiety. No chest pain or shortness of breath. No edema.    Past Medical History-  Patient Active Problem List   Diagnosis Date Noted  . Factor V Leiden mutation ([place]) 11/28/2016    Priority: High  . Asthma   . Generalized anxiety disorder 02/20/2013  . Attention deficit disorder (ADD) in adult 05/25/2012    Medications- reviewed and updated Current Outpatient Prescriptions  Medication Sig Dispense Refill  . escitalopram (LEXAPRO) 20 MG tablet Take 1 tablet (20 mg total) by mouth daily. 30 tablet 5  . methylphenidate (RITALIN) 20 MG tablet Take 1 tablet (20 mg total) by mouth 3 (three) times daily as needed (may fill in 2 months). 90 tablet 0  . methylphenidate (RITALIN) 20 MG tablet Take 1 tablet (20 mg total) by mouth 3 (three) times daily as needed. May fill today 90 tablet 0  . methylphenidate (RITALIN) 20 MG tablet Take 1 tablet (20 mg total) by mouth 3 (three) times daily as needed. May fill in 1 month 90 tablet 0  . albuterol (PROVENTIL HFA;VENTOLIN HFA) 108 (90 BASE) MCG/ACT inhaler Inhale 2 puffs into the lungs every 6 (six) hours as needed for wheezing. 1 Inhaler 3   No current facility-administered medications for this visit.     Objective: BP 114/80 (BP Location: Left Arm, Patient Position: Sitting, Cuff Size: Large)   Pulse 80   Temp 97.8 F (36.6 C) (Oral)   Ht 5' 10"  (1.778 m)   Wt 200 lb 12.8 oz (91.1 kg)   SpO2 98%   BMI 28.81 kg/m  Gen: NAD, resting comfortably CV: RRR no murmurs rubs or gallops Lungs: CTAB no crackles, wheeze, rhonchi Abdomen: soft/nontende. 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 On 258m trialed for a few months and just felt mentally  slowed- did not tolerate it and low energy- improved on half the dose. Still has sleep issues even without ADD medicines.  GAD7 today 13. Peak was 14.  PHQ9 today 8. No SI. Peak was 9.   celexa and zoloft in the past not helpful. Declines zoloft.   Had been to counselor 4 years ago- starting to think about going again.  A/P: poor control of both GAD and depression today. States ADD medicine does not worsen anxiety or sleep issues- will continue for now. Given lack of improvement despite multiple trials- discussed options of another trial medication like prozac or SNRI but transitions have been very useful- he prefers not to trial again.   Declines counseling again- starting to feel more open to this though and may reach out to his counselor from years ago.  Discussed psychiatry consult and he agrees- printed some options from [email or web address] and also discussed reaching out to his insurance  Attention deficit disorder (ADD) in adult S: ADD well controlled. Uses as low as 1/2 or 1/3rd of a pill. up to TID maximum but often can use just twice a day. States symptoms not worse for anxiety on days he uses ADD meds vs. Days he does not use ADD meds A/P: continue current medications- refilled for him.   Factor V Leiden [facility] ([facility]:Dr. GrBeryle Beamsuggested testing as mother with history of DVT and found to have factor V leiden. 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 We discussed that he has increased risk in range 3-8x increased risk. He undrestands and we discussed signs and symptoms to look out for  can refill ADD meds once more before needs repeat visit.   Orders Placed This Encounter  Procedures  . Flu Vaccine QUAD 36+ mos IM    Meds ordered this encounter  Medications  . methylphenidate (RITALIN) 20 MG tablet    Sig: Take 1 tablet (20 mg total) by  mouth 3 (three) times daily as needed (may fill in 2 months).    Dispense:  90 tablet    Refill:  0  . methylphenidate (RITALIN) 20 MG tablet    Sig: Take 1 tablet (20 mg total) by mouth 3 (three) times daily as needed. May fill today    Dispense:  90 tablet    Refill:  0  . methylphenidate (RITALIN) 20 MG tablet    Sig: Take 1 tablet (20 mg total) by mouth 3 (three) times daily as needed. May fill in 1 month    Dispense:  90 tablet    Refill:  0    Return precautions advised.  Garret Reddish, MD

## 2017-07-20 NOTE — Assessment & Plan Note (Addendum)
S: ADD well controlled. Ritalin 30m- Uses as low as 1/2 or 1/3rd of a pill. up to TID maximum but often can use just twice a day. States symptoms not worse for anxiety on days he uses ADD meds vs. Days he does not use ADD meds A/P: continue current medications- refilled for him.

## 2017-07-20 NOTE — Assessment & Plan Note (Signed)
S:Dr. Beryle Beams suggested testing as mother with history of DVT and found to have factor V leiden. 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 We discussed that he has increased risk in range 3-8x increased risk. He undrestands and we discussed signs and symptoms to look out for

## 2017-07-21 ENCOUNTER — Other Ambulatory Visit: Payer: Self-pay | Admitting: Family Medicine

## 2018-03-05 ENCOUNTER — Ambulatory Visit: Payer: 59 | Admitting: Family Medicine

## 2018-03-08 ENCOUNTER — Ambulatory Visit: Payer: 59 | Admitting: Family Medicine

## 2018-03-08 ENCOUNTER — Encounter: Payer: Self-pay | Admitting: Family Medicine

## 2018-03-08 VITALS — BP 118/78 | HR 72 | Temp 97.8°F | Ht 70.0 in | Wt 191.6 lb

## 2018-03-08 DIAGNOSIS — Z79899 Other long term (current) drug therapy: Secondary | ICD-10-CM | POA: Diagnosis not present

## 2018-03-08 DIAGNOSIS — Z23 Encounter for immunization: Secondary | ICD-10-CM | POA: Diagnosis not present

## 2018-03-08 DIAGNOSIS — D6851 Activated protein C resistance: Secondary | ICD-10-CM

## 2018-03-08 DIAGNOSIS — F411 Generalized anxiety disorder: Secondary | ICD-10-CM

## 2018-03-08 DIAGNOSIS — J4520 Mild intermittent asthma, uncomplicated: Secondary | ICD-10-CM | POA: Diagnosis not present

## 2018-03-08 DIAGNOSIS — F988 Other specified behavioral and emotional disorders with onset usually occurring in childhood and adolescence: Secondary | ICD-10-CM

## 2018-03-08 MED ORDER — ESCITALOPRAM OXALATE 10 MG PO TABS: 10.0000 mg | ORAL_TABLET | Freq: Every day | ORAL | 2 refills | Status: DC

## 2018-03-08 MED ORDER — METHYLPHENIDATE HCL 20 MG PO TABS: 20.0000 mg | ORAL_TABLET | Freq: Three times a day (TID) | ORAL | 0 refills | Status: DC | PRN

## 2018-03-08 NOTE — Patient Instructions (Addendum)
Health Maintenance Due  Topic Date Due  . HIV Screening - Patient Declined 03/09/2007   Our team will set you up with controlled substance contract  Please stop by lab before you go. Water available to help you pee.   Final hepatitis B today  Glad you already have appointment with travel clinic to discuss malaria, typhoid, and yellow fever risks or other concerns they may have  Change lexapro to 52m pill so you dont have to cut in half anymore  7-8 months go ahead and schedule a physical and we can get some baseline labs on you  Great job with healthy eating and weight loss

## 2018-03-08 NOTE — Assessment & Plan Note (Signed)
We discussed risk of DVT/PE. He does not want lifelong anticoagulation unless has DVT/PE- we discussed warning signs.

## 2018-03-08 NOTE — Assessment & Plan Note (Signed)
S: remains controlled on ritalin 81m TID. Can sometimes uses as little as 1/2 or 1/3 of pill. If really needs to focus uses TID but often does BID.   Symptoms of anxiety not worse with higher doses and not improved on days off medicine A/P: continue current rx - refill today for 3 months. Can do 1 more refill before cpe in 7-8 months (asked him to go ahead and schedule) - [facility] [place] - [place] [employer]- 08/07/15 formal diagnosis by psychology - UDS today- first - sign controlled substance contract today

## 2018-03-08 NOTE — Addendum Note (Signed)
Addended by: Everrett Coombe on: 03/08/2018 10:50 AM   Modules accepted: Orders

## 2018-03-08 NOTE — Assessment & Plan Note (Signed)
S:  PHQ9 of 2 and GAD7 of 7. Last visit PHQ9 of 8 and GAD7 of 13.   He was on 12m lexapro last visit but felt mentally slowed so went to 10 mg dose. Have tried multiple other trials. We had also discussed SNRI trial or prozac last visit but with multiple prior trials he opted out.   He has not been open to counseling- had done 4 years ago.   Referred to psychiatry last visit and he did not follow through.  A/P: this is the best he has felt in some time- cant put his finger on it exactly what has improved- perhaps eating better, taking more time to care for himself. He wants to remain on 173mdose of lexapro- we changed this to 1041mnly

## 2018-03-08 NOTE — Progress Notes (Signed)
Subjective:  Benjamin [name] is a 27 y.o. year old very pleasant male patient who presents for/with See problem oriented charting ROS- No chest pain or shortness of breath. No headache or blurry vision.  No unintentional weight loss- has lost weight but trying. No palpitations.    Past Medical History-  Patient Active Problem List   Diagnosis Date Noted  . Factor V Leiden mutation ([place]) 11/28/2016    Priority: High  . Attention deficit disorder (ADD) in adult 05/25/2012    Priority: High  . Asthma     Priority: Medium  . Generalized anxiety disorder 02/20/2013    Priority: Medium    Medications- reviewed and updated Current Outpatient Medications  Medication Sig Dispense Refill  . albuterol (PROVENTIL HFA;VENTOLIN HFA) 108 (90 BASE) MCG/ACT inhaler Inhale 2 puffs into the lungs every 6 (six) hours as needed for wheezing. 1 Inhaler 3  . escitalopram (LEXAPRO) 10 MG tablet Take 1 tablet (10 mg total) by mouth daily. 90 tablet 2  . methylphenidate (RITALIN) 20 MG tablet Take 1 tablet (20 mg total) by mouth 3 (three) times daily as needed (may fill in 2 months). 90 tablet 0  . methylphenidate (RITALIN) 20 MG tablet Take 1 tablet (20 mg total) by mouth 3 (three) times daily as needed. May fill today 90 tablet 0  . methylphenidate (RITALIN) 20 MG tablet Take 1 tablet (20 mg total) by mouth 3 (three) times daily as needed. May fill in 1 month 90 tablet 0   No current facility-administered medications for this visit.     Objective: BP 118/78 (BP Location: Left Arm, Patient Position: Sitting, Cuff Size: Normal)   Pulse 72   Temp 97.8 F (36.6 C) (Oral)   Ht 5' 10"  (1.778 m)   Wt 191 lb 9.6 oz (86.9 kg)   SpO2 98%   BMI 27.49 kg/m  Gen: NAD, resting comfortably, muscular build CV: RRR no murmurs rubs or gallops Lungs: CTAB no crackles, wheeze, rhonchi Abdomen: soft/nontender/nondistended/normal bowel sounds. No rebound or guarding.  Ext: no edema Skin: warm,  dry  Assessment/Plan:  Other notes: 1.lost some weight due to Eating cleaner . 2nd consecutive visit! congratulated 2. [place]- safari coming up ([place] of [place]). Only had 2 hep B and not clear had chicken pox vaccine. He is going to ask travel clinic about typhoid, malaria, yellow fever risk.   Attention deficit disorder (ADD) in adult S: remains controlled on ritalin 44m TID. Can sometimes uses as little as 1/2 or 1/3 of pill. If really needs to focus uses TID but often does BID.   Symptoms of anxiety not worse with higher doses and not improved on days off medicine A/P: continue current rx - refill today for 3 months. Can do 1 more refill before cpe in 7-8 months (asked him to go ahead and schedule) - [facility] [place] - [place] through [employer]- 08/07/15 formal diagnosis by psychology - UDS today- first - sign controlled substance contract today  Generalized anxiety disorder S:  PHQ9 of 2 and GAD7 of 7. Last visit PHQ9 of 8 and GAD7 of 13.   He was on 221mlexapro last visit but felt mentally slowed so went to 10 mg dose. Have tried multiple other trials. We had also discussed SNRI trial or prozac last visit but with multiple prior trials he opted out.   He has not been open to counseling- had done 4 years ago.   Referred to psychiatry  last visit and he did not follow through.  A/P: this is the best he has felt in some time- cant put his finger on it exactly what has improved- perhaps eating better, taking more time to care for himself. He wants to remain on 40m dose of lexapro- we changed this to 127monly  Asthma No recent issues even before sports  [facility]. He does not want lifelong anticoagulation unless has DVT/PE- we discussed warning signs.   CPE planned 7-8 months (advised)- would come fasting and update basic bloodwork  Lab/Order associations: High risk medication use - Plan: Pain Mgmt, Profile 8  w/Conf, U  Meds ordered this encounter  Medications  . methylphenidate (RITALIN) 20 MG tablet    Sig: Take 1 tablet (20 mg total) by mouth 3 (three) times daily as needed (may fill in 2 months).    Dispense:  90 tablet    Refill:  0  . methylphenidate (RITALIN) 20 MG tablet    Sig: Take 1 tablet (20 mg total) by mouth 3 (three) times daily as needed. May fill today    Dispense:  90 tablet    Refill:  0  . methylphenidate (RITALIN) 20 MG tablet    Sig: Take 1 tablet (20 mg total) by mouth 3 (three) times daily as needed. May fill in 1 month    Dispense:  90 tablet    Refill:  0  . escitalopram (LEXAPRO) 10 MG tablet    Sig: Take 1 tablet (10 mg total) by mouth daily.    Dispense:  90 tablet    Refill:  2    Return precautions advised.  StGarret ReddishMD

## 2018-03-08 NOTE — Assessment & Plan Note (Signed)
No recent issues even before sports

## 2018-03-10 LAB — PAIN MGMT, PROFILE 8 W/CONF, U
6 Acetylmorphine: NEGATIVE ng/mL (ref <10)
Alcohol Metabolites: POSITIVE ng/mL — AB (ref <500)
Amphetamines: NEGATIVE ng/mL (ref <500)
BUPRENORPHINE, URINE: NEGATIVE ng/mL (ref <5)
Benzodiazepines: NEGATIVE ng/mL (ref <100)
Cocaine Metabolite: NEGATIVE ng/mL (ref <150)
Creatinine: 234.8 mg/dL
ETHYL SULFATE (ETS): 29113 ng/mL — AB (ref <100)
Ethyl Glucuronide (ETG): 143072 ng/mL — ABNORMAL HIGH (ref <500)
MDMA: NEGATIVE ng/mL (ref <500)
Marijuana Metabolite: NEGATIVE ng/mL (ref <20)
OPIATES: NEGATIVE ng/mL (ref <100)
Oxidant: NEGATIVE ug/mL (ref <200)
Oxycodone: NEGATIVE ng/mL (ref <100)
pH: 6.36 (ref 4.5–9.0)

## 2018-03-18 DIAGNOSIS — Z23 Encounter for immunization: Secondary | ICD-10-CM | POA: Diagnosis not present

## 2018-03-30 DIAGNOSIS — M9902 Segmental and somatic dysfunction of thoracic region: Secondary | ICD-10-CM | POA: Diagnosis not present

## 2018-03-30 DIAGNOSIS — M9901 Segmental and somatic dysfunction of cervical region: Secondary | ICD-10-CM | POA: Diagnosis not present

## 2018-03-30 DIAGNOSIS — M791 Myalgia, unspecified site: Secondary | ICD-10-CM | POA: Diagnosis not present

## 2018-04-02 DIAGNOSIS — M9901 Segmental and somatic dysfunction of cervical region: Secondary | ICD-10-CM | POA: Diagnosis not present

## 2018-04-02 DIAGNOSIS — M791 Myalgia, unspecified site: Secondary | ICD-10-CM | POA: Diagnosis not present

## 2018-04-02 DIAGNOSIS — M9902 Segmental and somatic dysfunction of thoracic region: Secondary | ICD-10-CM | POA: Diagnosis not present

## 2018-04-05 DIAGNOSIS — M9902 Segmental and somatic dysfunction of thoracic region: Secondary | ICD-10-CM | POA: Diagnosis not present

## 2018-04-05 DIAGNOSIS — M9901 Segmental and somatic dysfunction of cervical region: Secondary | ICD-10-CM | POA: Diagnosis not present

## 2018-04-05 DIAGNOSIS — S134XXA Sprain of ligaments of cervical spine, initial encounter: Secondary | ICD-10-CM | POA: Diagnosis not present

## 2018-04-05 DIAGNOSIS — M791 Myalgia, unspecified site: Secondary | ICD-10-CM | POA: Diagnosis not present

## 2018-04-26 ENCOUNTER — Telehealth: Payer: Self-pay | Admitting: Family Medicine

## 2018-04-26 NOTE — Telephone Encounter (Signed)
Copied from [place] [id number]. Topic: General - Other >> Apr 26, 2018  3:06 PM Gardiner Ramus wrote: Reason for CRM: pt going to [place] 05/05/18 and needs a note or a copy of methylphenidate (RITALIN) 20 MG tablet prescription to be able to travel with medication [phone]

## 2018-04-28 NOTE — Telephone Encounter (Signed)
Looks like he needs this printed so may print an rx for this and Ill sign tomorrow

## 2018-04-28 NOTE — Telephone Encounter (Signed)
You may write a letter stating that patient may travel with his medication.

## 2018-04-28 NOTE — Telephone Encounter (Signed)
Last OV was 03/08/18. A 3 month prescription was provided at the time of visit. Please advise

## 2018-04-29 ENCOUNTER — Other Ambulatory Visit: Payer: Self-pay

## 2018-04-29 MED ORDER — METHYLPHENIDATE HCL 20 MG PO TABS: 20.0000 mg | ORAL_TABLET | Freq: Three times a day (TID) | ORAL | 0 refills | Status: DC | PRN

## 2018-04-29 NOTE — Telephone Encounter (Signed)
Called patient and left a voicemail message asking for a return phone call. If patient is traveling and keeps his medication in the prescription bottle with his name on it he shouldn't need a note. Please clarify.

## 2018-04-30 ENCOUNTER — Encounter: Payer: Self-pay | Admitting: Family Medicine

## 2018-04-30 ENCOUNTER — Ambulatory Visit: Payer: BLUE CROSS/BLUE SHIELD | Admitting: Family Medicine

## 2018-04-30 VITALS — BP 138/98 | HR 82 | Temp 97.8°F | Ht 70.0 in | Wt 195.0 lb

## 2018-04-30 DIAGNOSIS — L237 Allergic contact dermatitis due to plants, except food: Secondary | ICD-10-CM

## 2018-04-30 DIAGNOSIS — R030 Elevated blood-pressure reading, without diagnosis of hypertension: Secondary | ICD-10-CM

## 2018-04-30 DIAGNOSIS — M542 Cervicalgia: Secondary | ICD-10-CM

## 2018-04-30 MED ORDER — TRIAMCINOLONE ACETONIDE 0.1 % EX CREA: 1.0000 "application " | TOPICAL_CREAM | Freq: Two times a day (BID) | CUTANEOUS | 0 refills | Status: DC

## 2018-04-30 MED ORDER — METHYLPREDNISOLONE ACETATE 80 MG/ML IJ SUSP
80.0000 mg | Freq: Once | INTRAMUSCULAR | Status: AC
  Administered 2018-04-30: 80 mg via INTRAMUSCULAR

## 2018-04-30 NOTE — Patient Instructions (Addendum)
Steroid injection today. Depo medrol 9m  Will also give you some cream you can use topically for the itch  Hope you have a great trip!   Schedule with Dr. RPaulla Forewhen you get back from your trip.   Poison Ivy Dermatitis Poison ivy dermatitis is redness and soreness (inflammation) of the skin. It is caused by a chemical that is found on the leaves of the poison ivy plant. You may also have itching, a rash, and blisters. Symptoms often clear up in 1-2 weeks. You may get this condition by touching a poison ivy plant. You can also get it by touching something that has the chemical on it. This may include animals or objects that have come in contact with the plant. Follow these instructions at home: General instructions  Take or apply over-the-counter and prescription medicines only as told by your doctor.  If you touch poison ivy, wash your skin with soap and cold water right away.  Use hydrocortisone creams or calamine lotion as needed to help with itching.  Take oatmeal baths as needed. Use colloidal oatmeal. You can get this at a pharmacy or grocery store. Follow the instructions on the package.  Do not scratch or rub your skin.  While you have the rash, wash your clothes right after you wear them. Prevention  Know what poison ivy looks like so you can avoid it. This plant has three leaves with flowering branches on a single stem. The leaves are glossy. They have uneven edges that come to a point at the front.  If you have touched poison ivy, wash with soap and water right away. Be sure to wash under your fingernails.  When hiking or camping, wear long pants, a long-sleeved shirt, tall socks, and hiking boots. You can also use a lotion on your skin that helps to prevent contact with the chemical on the plant.  If you think that your clothes or outdoor gear came in contact with poison ivy, rinse them off with a garden hose before you bring them inside your house. Contact a doctor  if:  You have open sores in the rash area.  You have more redness, swelling, or pain in the affected area.  You have redness that spreads beyond the rash area.  You have fluid, blood, or pus coming from the affected area.  You have a fever.  You have a rash over a large area of your body.  You have a rash on your eyes, mouth, or genitals.  Your rash does not get better after a few days. Get help right away if:  Your face swells or your eyes swell shut.  You have trouble breathing.  You have trouble swallowing. This information is not intended to replace advice given to you by your health care provider. Make sure you discuss any questions you have with your health care provider. Document Released: 10/25/2010 Document Revised: 02/28/2016 Document Reviewed: 02/28/2015 Elsevier Interactive Patient Education  2[employer]

## 2018-04-30 NOTE — Progress Notes (Signed)
Subjective:  Benjamin [name] is a 27 y.o. year old very pleasant male patient who presents for/with See problem oriented charting ROS- no fever, chills, nausea, vomiting. Does have left neck pain    Past Medical History-  Patient Active Problem List   Diagnosis Date Noted  . Factor V Leiden mutation ([place]) 11/28/2016    Priority: High  . Attention deficit disorder (ADD) in adult 05/25/2012    Priority: High  . Asthma     Priority: Medium  . Generalized anxiety disorder 02/20/2013    Priority: Medium    Medications- reviewed and updated Current Outpatient Medications  Medication Sig Dispense Refill  . albuterol (PROVENTIL HFA;VENTOLIN HFA) 108 (90 BASE) MCG/ACT inhaler Inhale 2 puffs into the lungs every 6 (six) hours as needed for wheezing. 1 Inhaler 3  . escitalopram (LEXAPRO) 10 MG tablet Take 1 tablet (10 mg total) by mouth daily. 90 tablet 2  . methylphenidate (RITALIN) 20 MG tablet Take 1 tablet (20 mg total) by mouth 3 (three) times daily as needed (may fill in 2 months). 90 tablet 0  . methylphenidate (RITALIN) 20 MG tablet Take 1 tablet (20 mg total) by mouth 3 (three) times daily as needed. May fill in 1 month 90 tablet 0  . methylphenidate (RITALIN) 20 MG tablet Take 1 tablet (20 mg total) by mouth 3 (three) times daily as needed. May fill today 90 tablet 0   No current facility-administered medications for this visit.     Objective: BP (!) 138/98   Pulse 82   Temp 97.8 F (36.6 C) (Oral)   Ht 5' 10"  (1.778 m)   Wt 195 lb (88.5 kg)   SpO2 96%   BMI 27.98 kg/m  Gen: NAD, resting comfortably Mucous membranes are moist. CV: RRR  Lungs: nonlabored, normal respiratory rate Abdomen: soft/nondistended Ext: no edema Skin: warm, dry, multiple vesicles on both forearms as well as left flank in longitudinal pattern  Assessment/Plan:  Allergic contact dermatitis due to plants, except food - Plan: methylPREDNISolone acetate (DEPO-MEDROL) injection 80 mg S:  Patient was out pulling weeds on Monday.  He noted an itchy rash the next day. Intense itching. Benadryl cream this AM helped.  Denies pain.  Spread for the first day or 2 but now stable.  No new lesions in last 24 hours.  Denies pain.  Denies expanding redness from lesions.  He realized he had been exposed to poison ivy. A/P: 27 year old male with contact dermatitis due to poison ivy.  We will treat with Depo-Medrol 80 mg. Will also give triamcinolone that he can use topically for itch  Neck pain on left side - Plan: Ambulatory referral to Sports Medicine S:Month of left neck pain. Started 3 years ago after doing hand stand push up and falling hard onto head. Usually 1-2x a year or stress related. Recently over last month has been persistent daily. Causing headaches at times. Has seen chiropractor and not improving A/P: given chronicity of pain overall plus pain over the whole last month- Consider getting x-rays today but our tech was not available.  Offered return visit with me versus visit with Dr. Avon Gully would prefer to visit with Dr. Ronnald Collum made.  Elevated blood pressure  s: controlled  Poorly on no rx BP Readings from Last 3 Encounters:  04/30/18 (!) 138/98  03/08/18 118/78  07/20/17 114/80  A/P: We discussed blood pressure goal of <140/90.  Patient states he has high trip anxiety with his upcoming trip to [place].  We  have follow-up in January for recheck of ADD and we can recheck at that time- I also asked him to let me know if blood pressure is elevated when he sees Dr. Paulla Fore  Future Appointments  Date Time Provider [place]  10/14/2018 11:15 AM Yong Channel, Brayton Mars, MD [facility]   Lab/Order associations: Allergic contact dermatitis due to plants, except food - Plan: methylPREDNISolone acetate (DEPO-MEDROL) injection 80 mg  Neck pain on left side - Plan: Ambulatory referral to Sports Medicine  Meds ordered this encounter  Medications  . methylPREDNISolone  acetate (DEPO-MEDROL) injection 80 mg   Return precautions advised.  Garret Reddish, MD

## 2018-07-21 ENCOUNTER — Other Ambulatory Visit: Payer: Self-pay | Admitting: Family Medicine

## 2018-07-21 DIAGNOSIS — H04123 Dry eye syndrome of bilateral lacrimal glands: Secondary | ICD-10-CM | POA: Diagnosis not present

## 2018-07-21 NOTE — Telephone Encounter (Signed)
Copied from [place] [phone]. Topic: Quick Communication - Rx Refill/Question >> Jul 21, 2018 10:54 AM Sheran Luz wrote: Medication: methylphenidate (RITALIN) 20 MG tablet Pt called stating this medication was stolen or lost and is requesting a refill. Advised pt he may need appointment.   Preferred Pharmacy (with phone number or street name): [id number] - [place], [address] [place] [phone] (Phone) [phone] (Fax)

## 2018-07-21 NOTE — Telephone Encounter (Signed)
See pt. Request. Last refill 04/29/18 Reports medication lost or stolen.

## 2018-07-22 NOTE — Telephone Encounter (Signed)
Unfortunately his last blood pressure was elevated.  We need to have him back in to make sure blood pressure looks better before prescribing this medication which can raise blood pressure again.  Please deny refill but encourage follow-up

## 2018-07-22 NOTE — Telephone Encounter (Signed)
Patient was given Dr. Ansel Bong instructions and he would like to come in to see Dr. Yong Channel ASAP.  There are no appointments open. The patient is anxious because he says he has NO medication left. Please advise.

## 2018-07-23 NOTE — Telephone Encounter (Signed)
Dr. Yong Channel, [facility] to use same day slot? No appts available for the rest of this month.  Please advise

## 2018-07-24 NOTE — Telephone Encounter (Signed)
Yes may use same day slot

## 2018-07-26 NOTE — Telephone Encounter (Signed)
Please contact patient to schedule for same day slot follow up appointment, send in basket message for same day agreed on and I can overbook.   Thank you

## 2018-07-26 NOTE — Telephone Encounter (Signed)
Note from Dr. Yong Channel to schedule appointment prior to further refills due to elevated blood pressure

## 2018-07-26 NOTE — Telephone Encounter (Signed)
Please see msg.  Pt needs f/u asap, ok per Dr. Yong Channel to use same day appt slot.

## 2018-07-27 ENCOUNTER — Ambulatory Visit: Payer: BLUE CROSS/BLUE SHIELD | Admitting: Family Medicine

## 2018-07-27 DIAGNOSIS — Z0289 Encounter for other administrative examinations: Secondary | ICD-10-CM

## 2018-07-30 ENCOUNTER — Encounter: Payer: Self-pay | Admitting: Family Medicine

## 2018-07-30 ENCOUNTER — Ambulatory Visit: Payer: BLUE CROSS/BLUE SHIELD | Admitting: Family Medicine

## 2018-07-30 VITALS — BP 126/92 | HR 93 | Temp 97.6°F | Ht 70.0 in | Wt 199.4 lb

## 2018-07-30 DIAGNOSIS — Z8379 Family history of other diseases of the digestive system: Secondary | ICD-10-CM

## 2018-07-30 DIAGNOSIS — K921 Melena: Secondary | ICD-10-CM | POA: Diagnosis not present

## 2018-07-30 DIAGNOSIS — F988 Other specified behavioral and emotional disorders with onset usually occurring in childhood and adolescence: Secondary | ICD-10-CM | POA: Diagnosis not present

## 2018-07-30 DIAGNOSIS — R109 Unspecified abdominal pain: Secondary | ICD-10-CM

## 2018-07-30 DIAGNOSIS — Z23 Encounter for immunization: Secondary | ICD-10-CM | POA: Diagnosis not present

## 2018-07-30 DIAGNOSIS — R030 Elevated blood-pressure reading, without diagnosis of hypertension: Secondary | ICD-10-CM | POA: Diagnosis not present

## 2018-07-30 MED ORDER — METHYLPHENIDATE HCL 20 MG PO TABS: 20.0000 mg | ORAL_TABLET | Freq: Two times a day (BID) | ORAL | 0 refills | Status: DC

## 2018-07-30 NOTE — Patient Instructions (Addendum)
thanks for doing flu shot  Filled 3 months prescription, can fill again in 3 months, need to see you every 6 months for refills  Try to check blood pressure dialy when back on ADD meds and update me on mychart in a 1-2 weeks. After that if they look good- at least check once a week or so to make sure <140/90. Above these #s can increase your long term risks for cardiac disease

## 2018-07-30 NOTE — Progress Notes (Signed)
Subjective:  Benjamin [name] is a 27 y.o. year old very pleasant male patient who presents for/with See problem oriented charting ROS- abdominal cramping, bloating, frequent BMs, occasional blood in stool.    Past Medical History-  Patient Active Problem List   Diagnosis Date Noted  . Factor V Leiden mutation ([place]) 11/28/2016    Priority: High  . Attention deficit disorder (ADD) in adult 05/25/2012    Priority: High  . Asthma     Priority: Medium  . Generalized anxiety disorder 02/20/2013    Priority: Medium  . White coat syndrome without hypertension 07/31/2018    Medications- reviewed and updated Current Outpatient Medications  Medication Sig Dispense Refill  . escitalopram (LEXAPRO) 10 MG tablet Take 1 tablet (10 mg total) by mouth daily. 90 tablet 2  . methylphenidate (RITALIN) 20 MG tablet Take 1 tablet (20 mg total) by mouth 2 (two) times daily with breakfast and lunch. May fill in 2 months 60 tablet 0  . methylphenidate (RITALIN) 20 MG tablet Take 1 tablet (20 mg total) by mouth 2 (two) times daily with breakfast and lunch. May fill in 1 month 60 tablet 0  . methylphenidate (RITALIN) 20 MG tablet Take 1 tablet (20 mg total) by mouth 2 (two) times daily with breakfast and lunch. May fill today 60 tablet 0  . triamcinolone cream (KENALOG) 0.1 % Apply 1 application topically 2 (two) times daily. For 7-10 days maximum 80 g 0  . albuterol (PROVENTIL HFA;VENTOLIN HFA) 108 (90 BASE) MCG/ACT inhaler Inhale 2 puffs into the lungs every 6 (six) hours as needed for wheezing. 1 Inhaler 3   No current facility-administered medications for this visit.     Objective: BP (!) 126/92   Pulse 93   Temp 97.6 F (36.4 C) (Oral)   Ht 5' 10"  (1.778 m)   Wt 199 lb 6.4 oz (90.4 kg)   SpO2 97%   BMI 28.61 kg/m  Gen: NAD, resting comfortably CV: RRR no murmurs rubs or gallops Lungs: CTAB no crackles, wheeze, rhonchi Abdomen: soft/nontender/nondistended/normal bowel sounds. No rebound  or guarding.  Ext: no edema Skin: warm, dry   Assessment/Plan:  Abdominal cramping, blood in stool in setting of Family history of Crohn's disease S: Dad has crohn's disease. Patient uses bathroom 4-5x a day. Stomach always upset. Has noted some blood in stoo- bright red. Gets abdominal cramping A/P: patient concerned about possible crohn's -refer to GI for evaluation given his symptoms.   White coat syndrome without hypertension S: controlled at home on no medication. Home #s 130/82 when last checked on Monday. Has taken while on ADD meds and not elevated at those times later.  BP Readings from Last 3 Encounters:  07/30/18 (!) 126/92  04/30/18 (!) 138/98  03/08/18 118/78  A/P: We discussed blood pressure goal of <140/90- he appears to be well below this at home but have encouraged home monitoring for 7-10 days while on ADD meds and then update me- we may be forced to look at alternatives that are better for blood pressure if home #s are high   Attention deficit disorder (ADD) in adult S: Patient has been doing well on Ritalin 20 mg 3 times daily.  I wanted to have him in today because of blood pressure was rather elevated last visit. A/P: Stable. Continue current medications.  - see white oat hypertension notes but may have to look at alternate medications that are easier on  - Diagnosed in 2011- Camillia Herter, [place], licensed psychological  associates -NCCSRS reviewed today- there was a discrepancy between prescriptions and # filled on [place]- we discovered that most recent printed prescription he didn't need to travel to [place] thus this was never picked up or filled. All other prescriptions have been from [place] here in office- no high risk use.   -UDS on 03/08/2018 -Controlled substance contract 03/26/2018  Future Appointments  Date Time Provider [place]  10/14/2018 11:20 AM Marin Olp, MD [facility]  01/28/2019  1:20 PM Marin Olp, MD [facility]    Lab/Order associations: Need for prophylactic vaccination and inoculation against influenza - Plan: Flu Vaccine QUAD 36+ mos IM  Family history of Crohn's disease - Plan: Ambulatory referral to Gastroenterology, C-reactive protein, Sedimentation rate, Comprehensive metabolic panel, CBC with Differential/Platelet, CBC with Differential/Platelet, Comprehensive metabolic panel, Sedimentation rate, C-reactive protein, CANCELED: CBC with Differential/Platelet, CANCELED: Comprehensive metabolic panel, CANCELED: Sedimentation rate, CANCELED: C-reactive protein  Abdominal cramping - Plan: Ambulatory referral to Gastroenterology, C-reactive protein, Sedimentation rate, Comprehensive metabolic panel, CBC with Differential/Platelet, CBC with Differential/Platelet, Comprehensive metabolic panel, Sedimentation rate, C-reactive protein, CANCELED: CBC with Differential/Platelet, CANCELED: Comprehensive metabolic panel, CANCELED: Sedimentation rate, CANCELED: C-reactive protein  Blood in stool - Plan: Ambulatory referral to Gastroenterology, C-reactive protein, Sedimentation rate, Comprehensive metabolic panel, CBC with Differential/Platelet, CBC with Differential/Platelet, Comprehensive metabolic panel, Sedimentation rate, C-reactive protein, CANCELED: CBC with Differential/Platelet, CANCELED: Comprehensive metabolic panel, CANCELED: Sedimentation rate, CANCELED: C-reactive protein  White coat syndrome without hypertension  Attention deficit disorder (ADD) in adult  Meds ordered this encounter  Medications  . methylphenidate (RITALIN) 20 MG tablet    Sig: Take 1 tablet (20 mg total) by mouth 2 (two) times daily with breakfast and lunch. May fill in 2 months    Dispense:  60 tablet    Refill:  0  . methylphenidate (RITALIN) 20 MG tablet    Sig: Take 1 tablet (20 mg total) by mouth 2 (two) times daily with breakfast and lunch. May fill in 1 month    Dispense:  60 tablet    Refill:  0  . methylphenidate  (RITALIN) 20 MG tablet    Sig: Take 1 tablet (20 mg total) by mouth 2 (two) times daily with breakfast and lunch. May fill today    Dispense:  60 tablet    Refill:  0    Return precautions advised.  Garret Reddish, MD

## 2018-07-31 DIAGNOSIS — I10 Essential (primary) hypertension: Secondary | ICD-10-CM | POA: Insufficient documentation

## 2018-07-31 DIAGNOSIS — R030 Elevated blood-pressure reading, without diagnosis of hypertension: Secondary | ICD-10-CM | POA: Insufficient documentation

## 2018-07-31 LAB — CBC WITH DIFFERENTIAL/PLATELET
BASOS PCT: 0.6 %
Basophils Absolute: 43 cells/uL (ref 0–200)
Eosinophils Absolute: 79 cells/uL (ref 15–500)
Eosinophils Relative: 1.1 %
HCT: 37.5 % — ABNORMAL LOW (ref 38.5–50.0)
Hemoglobin: 13.4 g/dL (ref 13.2–17.1)
Lymphs Abs: 1987 cells/uL (ref 850–3900)
MCH: 31.5 pg (ref 27.0–33.0)
MCHC: 35.7 g/dL (ref 32.0–36.0)
MCV: 88 fL (ref 80.0–100.0)
MONOS PCT: 12.6 %
MPV: 9.8 fL (ref 7.5–12.5)
NEUTROS ABS: 4183 {cells}/uL (ref 1500–7800)
Neutrophils Relative %: 58.1 %
PLATELETS: 424 10*3/uL — AB (ref 140–400)
RBC: 4.26 10*6/uL (ref 4.20–5.80)
RDW: 12.5 % (ref 11.0–15.0)
TOTAL LYMPHOCYTE: 27.6 %
WBC mixed population: 907 cells/uL (ref 200–950)
WBC: 7.2 10*3/uL (ref 3.8–10.8)

## 2018-07-31 LAB — COMPREHENSIVE METABOLIC PANEL
AG Ratio: 1.5 (calc) (ref 1.0–2.5)
ALBUMIN MSPROF: 4.5 g/dL (ref 3.6–5.1)
ALT: 43 U/L (ref 9–46)
AST: 52 U/L — ABNORMAL HIGH (ref 10–40)
Alkaline phosphatase (APISO): 110 U/L (ref 40–115)
BUN: 16 mg/dL (ref 7–25)
CHLORIDE: 101 mmol/L (ref 98–110)
CO2: 28 mmol/L (ref 20–32)
CREATININE: 1.1 mg/dL (ref 0.60–1.35)
Calcium: 9.8 mg/dL (ref 8.6–10.3)
GLOBULIN: 3.1 g/dL (ref 1.9–3.7)
GLUCOSE: 77 mg/dL (ref 65–99)
POTASSIUM: 4.3 mmol/L (ref 3.5–5.3)
SODIUM: 139 mmol/L (ref 135–146)
TOTAL PROTEIN: 7.6 g/dL (ref 6.1–8.1)
Total Bilirubin: 0.2 mg/dL (ref 0.2–1.2)

## 2018-07-31 LAB — SEDIMENTATION RATE: Sed Rate: 31 mm/h — ABNORMAL HIGH (ref 0–15)

## 2018-07-31 LAB — C-REACTIVE PROTEIN: CRP: 12.3 mg/L — AB (ref <8.0)

## 2018-07-31 NOTE — Assessment & Plan Note (Signed)
S: controlled at home on no medication. Home #s 130/82 when last checked on Monday. Has taken while on ADD meds and not elevated at those times later.  BP Readings from Last 3 Encounters:  07/30/18 (!) 126/92  04/30/18 (!) 138/98  03/08/18 118/78  A/P: We discussed blood pressure goal of <140/90- he appears to be well below this at home but have encouraged home monitoring for 7-10 days while on ADD meds and then update me- we may be forced to look at alternatives that are better for blood pressure if home #s are high

## 2018-07-31 NOTE — Assessment & Plan Note (Signed)
S: Patient has been doing well on Ritalin 20 mg 3 times daily.  I wanted to have him in today because of blood pressure was rather elevated last visit. A/P: Stable. Continue current medications.  - see white oat hypertension notes but may have to look at alternate medications that are easier on  - Diagnosed in 2011- Camillia Herter, [place], licensed psychological associates -NCCSRS reviewed today- there was a discrepancy between prescriptions and # filled on [place]- we discovered that most recent printed prescription he didn't need to travel to [place] thus this was never picked up or filled. All other prescriptions have been from [place] here in office- no high risk use.   -UDS on 03/08/2018 -Controlled substance contract 03/26/2018

## 2018-08-04 ENCOUNTER — Encounter: Payer: Self-pay | Admitting: Family Medicine

## 2018-08-06 ENCOUNTER — Telehealth: Payer: Self-pay | Admitting: Family Medicine

## 2018-08-06 NOTE — Telephone Encounter (Signed)
See note  Copied from [place] [id number]. Topic: General - Other >> Aug 06, 2018 11:45 AM Yvette Rack wrote: Reason for CRM: Pt called in to speak with Roselyn Reef. Pt states he read [facility] and apologizes that he did not call back sooner. Pt requests call back. Cb# [phone]

## 2018-08-06 NOTE — Telephone Encounter (Signed)
Called and spoke to patient who viewed his lab results and verbalized understanding

## 2018-10-14 ENCOUNTER — Ambulatory Visit: Payer: BLUE CROSS/BLUE SHIELD | Admitting: Family Medicine

## 2018-10-27 ENCOUNTER — Encounter: Payer: Self-pay | Admitting: Family Medicine

## 2018-10-27 ENCOUNTER — Ambulatory Visit: Payer: BLUE CROSS/BLUE SHIELD | Admitting: Family Medicine

## 2018-10-27 VITALS — BP 134/94 | HR 91 | Temp 98.0°F | Ht 70.0 in | Wt 204.8 lb

## 2018-10-27 DIAGNOSIS — F988 Other specified behavioral and emotional disorders with onset usually occurring in childhood and adolescence: Secondary | ICD-10-CM | POA: Diagnosis not present

## 2018-10-27 DIAGNOSIS — D6851 Activated protein C resistance: Secondary | ICD-10-CM

## 2018-10-27 DIAGNOSIS — R030 Elevated blood-pressure reading, without diagnosis of hypertension: Secondary | ICD-10-CM | POA: Diagnosis not present

## 2018-10-27 DIAGNOSIS — Z8379 Family history of other diseases of the digestive system: Secondary | ICD-10-CM

## 2018-10-27 DIAGNOSIS — R700 Elevated erythrocyte sedimentation rate: Secondary | ICD-10-CM | POA: Diagnosis not present

## 2018-10-27 DIAGNOSIS — Z6829 Body mass index (BMI) 29.0-29.9, adult: Secondary | ICD-10-CM

## 2018-10-27 NOTE — Patient Instructions (Addendum)
Blood pressure looks slightly better on repeat  I really want home blood pressure numbers to be closer to 120/80 but we can tolerate them as long as in the 130s over 80s-I want you to monitor over the next 10 days and then send me a message with your average over that time.  Please avoid decongestants as it seems that these raise your blood pressure into the 140/90 range  You do have 2 more prescriptions for Ritalin at your pharmacy- make sure to pick up the second 1 before the 28-month  Then see me back 2 weeks before you run out of medication.  We will change the dose to 5 or 10 mg at follow-up to be consistent with how you are taking it in light of blood pressure issues  Any success with [facility]?

## 2018-10-27 NOTE — Assessment & Plan Note (Signed)
S: Patient continues doing well on Ritalin 20 mg 2 times a day per prescription- Only taking generally 5 mg most mornings.  A/P: Stable-continue current medications-he has 2 refills available at the pharmacy.  We discussed at next visit we would reduce dose to 5 to 10 mg to be more consistent with what he is actually taking - See whitecoat hypertension section -Diagnosed 2011 by Camillia Herter, [place], license psychological Associates -NCCSRS reviewed today- no high risk use -UDS March 08, 2018 - Controlled substance contract March 26, 2018

## 2018-10-27 NOTE — Progress Notes (Addendum)
Subjective:  Benjamin [name] is a 28 y.o. year old very pleasant male patient who presents for/with See problem oriented charting ROS-no palpitations or chest pain.  No shortness of breath or blurry vision.  Past Medical History-  Patient Active Problem List   Diagnosis Date Noted  . Factor V Leiden mutation ([place]) 11/28/2016    Priority: High  . Attention deficit disorder (ADD) in adult 05/25/2012    Priority: High  . Asthma     Priority: Medium  . Generalized anxiety disorder 02/20/2013    Priority: Medium  . White coat syndrome without hypertension 07/31/2018    Medications- reviewed and updated Current Outpatient Medications  Medication Sig Dispense Refill  . escitalopram (LEXAPRO) 10 MG tablet Take 1 tablet (10 mg total) by mouth daily. 90 tablet 2  . methylphenidate (RITALIN) 20 MG tablet Take 1 tablet (20 mg total) by mouth 2 (two) times daily with breakfast and lunch. May fill in 2 months 60 tablet 0  . methylphenidate (RITALIN) 20 MG tablet Take 1 tablet (20 mg total) by mouth 2 (two) times daily with breakfast and lunch. May fill in 1 month 60 tablet 0  . methylphenidate (RITALIN) 20 MG tablet Take 1 tablet (20 mg total) by mouth 2 (two) times daily with breakfast and lunch. May fill today 60 tablet 0  . triamcinolone cream (KENALOG) 0.1 % Apply 1 application topically 2 (two) times daily. For 7-10 days maximum 80 g 0  . albuterol (PROVENTIL HFA;VENTOLIN HFA) 108 (90 BASE) MCG/ACT inhaler Inhale 2 puffs into the lungs every 6 (six) hours as needed for wheezing. 1 Inhaler 3   No current facility-administered medications for this visit.     Objective: BP (!) 134/94   Pulse 91   Temp 98 F (36.7 C) (Oral)   Ht 5' 10"  (1.778 m)   Wt 204 lb 12.8 oz (92.9 kg)   SpO2 97%   BMI 29.39 kg/m  Gen: NAD, resting comfortably CV: RRR no murmurs rubs or gallops Lungs: CTAB no crackles, wheeze, rhonchi  Ext: no edema Skin: warm, dry Psych: Slightly  anxious  Assessment/Plan:  Other notes: 1.Referred to GI last visit-he requested Dr. Watt Climes of [facility]- for some reason this was never scheduled.  I will reenter referral today-family history of Crohn's disease and patient with elevated ESR and CRP though mildly. Lab Results  Component Value Date   ESRSEDRATE 31 (H) 07/30/2018  2.  Complains of sinusitis for 3 weeks- finally doing better- wants to hold off on antibiotics.  3.  Known factor V Leiden mutation-heterozygous.  We have discussed previously-does not want lifelong anticoagulation unless has a DVT or PE  Attention deficit disorder (ADD) in adult S: Patient continues doing well on Ritalin 20 mg 2 times a day per prescription- Only taking generally 5 mg most mornings.  A/P: Stable-continue current medications-he has 2 refills available at the pharmacy.  We discussed at next visit we would reduce dose to 5 to 10 mg to be more consistent with what he is actually taking - See whitecoat hypertension section -Diagnosed 2011 by Camillia Herter, [place], license [facility] -NCCSRS reviewed today- no high risk use -UDS March 08, 2018 - Controlled substance contract March 26, 2018  White coat syndrome without hypertension S: Home #s 140/90 while on decongestant during recent sinus infection.  Numbers have been better prior to that.  He also admits to me today that he takes Afrin multiple doses a day for 13 years.  He  also thinks blood pressure could be slightly higher-Anxiety higher with school restarting.  Also starting to eat healthier and exercise more regularly BP Readings from Last 3 Encounters:  10/27/18 (!) 134/94  07/30/18 (!) 126/92  04/30/18 (!) 138/98  A/P: Poor control today and recently at home.  We discussed blood pressure goal of <140/90.  We discussed not using any decongestants.  Encouraged him to stop Afrin completely-he thinks this may be difficult.  Could consider adding Flonase.  Offered ENT referral and he  declines.  He has a follow-up in April and I encouraged him to keep this for another check on blood pressure and to see if he has reduced his Afrin use  Future Appointments  Date Time Provider [place]  01/28/2019  1:20 PM Marin Olp, MD [facility]   Lab/Order associations: Elevated sedimentation rate - Plan: Ambulatory referral to Gastroenterology  Family history of Crohn's disease - Plan: Ambulatory referral to Gastroenterology  Attention deficit disorder (ADD) in adult  White coat syndrome without hypertension  Return precautions advised.  Garret Reddish, MD

## 2018-10-27 NOTE — Assessment & Plan Note (Signed)
S: Home #s 140/90 while on decongestant during recent sinus infection.  Numbers have been better prior to that.  He also admits to me today that he takes Afrin multiple doses a day for 13 years.  He also thinks blood pressure could be slightly higher-Anxiety higher with school restarting.  Also starting to eat healthier and exercise more regularly BP Readings from Last 3 Encounters:  10/27/18 (!) 134/94  07/30/18 (!) 126/92  04/30/18 (!) 138/98  A/P: Poor control today and recently at home.  We discussed blood pressure goal of <140/90.  We discussed not using any decongestants.  Encouraged him to stop Afrin completely-he thinks this may be difficult.  Could consider adding Flonase.  Offered ENT referral and he declines.  He has a follow-up in April and I encouraged him to keep this for another check on blood pressure and to see if he has reduced his Afrin use

## 2018-12-21 ENCOUNTER — Other Ambulatory Visit: Payer: Self-pay | Admitting: Family Medicine

## 2018-12-23 ENCOUNTER — Other Ambulatory Visit: Payer: Self-pay | Admitting: Gastroenterology

## 2018-12-23 DIAGNOSIS — R1084 Generalized abdominal pain: Secondary | ICD-10-CM

## 2018-12-23 DIAGNOSIS — K529 Noninfective gastroenteritis and colitis, unspecified: Secondary | ICD-10-CM | POA: Diagnosis not present

## 2019-01-06 ENCOUNTER — Other Ambulatory Visit: Payer: Self-pay

## 2019-01-06 ENCOUNTER — Ambulatory Visit
Admission: RE | Admit: 2019-01-06 | Discharge: 2019-01-06 | Disposition: A | Discharge status: Home / Self Care | Payer: BLUE CROSS/BLUE SHIELD | Source: Ambulatory Visit | Attending: Gastroenterology | Admitting: Gastroenterology

## 2019-01-06 ENCOUNTER — Other Ambulatory Visit: Payer: BLUE CROSS/BLUE SHIELD

## 2019-01-06 DIAGNOSIS — R1084 Generalized abdominal pain: Secondary | ICD-10-CM

## 2019-01-06 DIAGNOSIS — K529 Noninfective gastroenteritis and colitis, unspecified: Secondary | ICD-10-CM

## 2019-01-06 DIAGNOSIS — K5190 Ulcerative colitis, unspecified, without complications: Secondary | ICD-10-CM | POA: Diagnosis not present

## 2019-01-06 MED ORDER — IOPAMIDOL (ISOVUE-300) INJECTION 61%
125.0000 mL | Freq: Once | INTRAVENOUS | Status: AC | PRN
  Administered 2019-01-06: 125 mL via INTRAVENOUS

## 2019-01-07 ENCOUNTER — Telehealth: Payer: Self-pay | Admitting: Family Medicine

## 2019-01-07 NOTE — Telephone Encounter (Signed)
Copied from [place] [phone]. Topic: Quick Communication - Rx Refill/Question >> Jan 07, 2019  5:17 PM Nils Flack, Marland Kitchen wrote: Medication: methylphenidate (RITALIN) 20 MG tablet  Has the patient contacted their pharmacy? yes (Agent: If no, request that the patient contact the pharmacy for the refill.) (Agent: If yes, when and what did the pharmacy advise?)  Preferred Pharmacy (with phone number or street name): [facility]  [facility] and he does not have any more fills  Agent: Please be advised that RX refills may take up to 3 business days. We ask that you follow-up with your pharmacy.

## 2019-01-10 ENCOUNTER — Other Ambulatory Visit: Payer: Self-pay

## 2019-01-10 NOTE — Telephone Encounter (Signed)
See request °

## 2019-01-10 NOTE — Telephone Encounter (Signed)
Ritalin 79m Last fill 07/30/18 #60/0 Last OV 10/27/18 Next OV 01/28/19

## 2019-01-10 NOTE — Telephone Encounter (Signed)
It looks like a refill on 3 April- he is scheduled on April 24 for follow-up-lets keep that as a virtual visit and we can refill at that time-does not look like he needs a refill right now

## 2019-01-10 NOTE — Telephone Encounter (Signed)
Per Dr. Yong Channel -  Yong Channel, Brayton Mars, MD  to Darral Dash, [facility]      01/10/19 10:49 AM  Note    It looks like a refill on 3 April- he is scheduled on April 24 for follow-up-lets keep that as a virtual visit and we can refill at that time-does not look like he needs a refill right now       [employer] and confirmed rx was picked up 01/07/2019.

## 2019-01-28 ENCOUNTER — Ambulatory Visit: Payer: BLUE CROSS/BLUE SHIELD | Admitting: Family Medicine

## 2019-03-18 DIAGNOSIS — Z20828 Contact with and (suspected) exposure to other viral communicable diseases: Secondary | ICD-10-CM | POA: Diagnosis not present

## 2019-03-25 ENCOUNTER — Encounter: Payer: Self-pay | Admitting: Family Medicine

## 2019-03-25 DIAGNOSIS — K921 Melena: Secondary | ICD-10-CM | POA: Diagnosis not present

## 2019-03-25 DIAGNOSIS — K5289 Other specified noninfective gastroenteritis and colitis: Secondary | ICD-10-CM | POA: Diagnosis not present

## 2019-03-25 DIAGNOSIS — R933 Abnormal findings on diagnostic imaging of other parts of digestive tract: Secondary | ICD-10-CM | POA: Diagnosis not present

## 2019-03-25 DIAGNOSIS — K529 Noninfective gastroenteritis and colitis, unspecified: Secondary | ICD-10-CM | POA: Diagnosis not present

## 2019-03-25 DIAGNOSIS — K6389 Other specified diseases of intestine: Secondary | ICD-10-CM | POA: Diagnosis not present

## 2019-03-25 LAB — HM COLONOSCOPY

## 2019-04-07 ENCOUNTER — Encounter: Payer: Self-pay | Admitting: Family Medicine

## 2019-04-07 DIAGNOSIS — K5190 Ulcerative colitis, unspecified, without complications: Secondary | ICD-10-CM | POA: Insufficient documentation

## 2019-04-27 DIAGNOSIS — K529 Noninfective gastroenteritis and colitis, unspecified: Secondary | ICD-10-CM | POA: Diagnosis not present

## 2019-04-28 ENCOUNTER — Other Ambulatory Visit: Payer: Self-pay

## 2019-04-28 ENCOUNTER — Ambulatory Visit (INDEPENDENT_AMBULATORY_CARE_PROVIDER_SITE_OTHER): Payer: BC Managed Care – PPO | Admitting: Family Medicine

## 2019-04-28 ENCOUNTER — Encounter: Payer: Self-pay | Admitting: Family Medicine

## 2019-04-28 VITALS — BP 140/110 | HR 65 | Temp 97.7°F | Ht 70.0 in | Wt 204.2 lb

## 2019-04-28 DIAGNOSIS — D6851 Activated protein C resistance: Secondary | ICD-10-CM

## 2019-04-28 DIAGNOSIS — R030 Elevated blood-pressure reading, without diagnosis of hypertension: Secondary | ICD-10-CM

## 2019-04-28 DIAGNOSIS — K5190 Ulcerative colitis, unspecified, without complications: Secondary | ICD-10-CM

## 2019-04-28 DIAGNOSIS — I10 Essential (primary) hypertension: Secondary | ICD-10-CM

## 2019-04-28 DIAGNOSIS — Z114 Encounter for screening for human immunodeficiency virus [HIV]: Secondary | ICD-10-CM | POA: Diagnosis not present

## 2019-04-28 DIAGNOSIS — Z13220 Encounter for screening for lipoid disorders: Secondary | ICD-10-CM

## 2019-04-28 DIAGNOSIS — F988 Other specified behavioral and emotional disorders with onset usually occurring in childhood and adolescence: Secondary | ICD-10-CM

## 2019-04-28 DIAGNOSIS — Z79899 Other long term (current) drug therapy: Secondary | ICD-10-CM

## 2019-04-28 LAB — COMPREHENSIVE METABOLIC PANEL
ALT: 31 U/L (ref 0–53)
AST: 20 U/L (ref 0–37)
Albumin: 4.7 g/dL (ref 3.5–5.2)
Alkaline Phosphatase: 81 U/L (ref 39–117)
BUN: 17 mg/dL (ref 6–23)
CO2: 25 mEq/L (ref 19–32)
Calcium: 9.7 mg/dL (ref 8.4–10.5)
Chloride: 105 mEq/L (ref 96–112)
Creatinine, Ser: 0.86 mg/dL (ref 0.40–1.50)
GFR: 106.56 mL/min (ref >60.00)
Glucose, Bld: 86 mg/dL (ref 70–99)
Potassium: 3.8 mEq/L (ref 3.5–5.1)
Sodium: 139 mEq/L (ref 135–145)
Total Bilirubin: 0.4 mg/dL (ref 0.2–1.2)
Total Protein: 7.4 g/dL (ref 6.0–8.3)

## 2019-04-28 LAB — CBC WITH DIFFERENTIAL/PLATELET
Basophils Absolute: 0 10*3/uL (ref 0.0–0.1)
Basophils Relative: 0.4 % (ref 0.0–3.0)
Eosinophils Absolute: 0.1 10*3/uL (ref 0.0–0.7)
Eosinophils Relative: 1.3 % (ref 0.0–5.0)
HCT: 38.6 % — ABNORMAL LOW (ref 39.0–52.0)
Hemoglobin: 12.9 g/dL — ABNORMAL LOW (ref 13.0–17.0)
Lymphocytes Relative: 39.8 % (ref 12.0–46.0)
Lymphs Abs: 2.1 10*3/uL (ref 0.7–4.0)
MCHC: 33.6 g/dL (ref 30.0–36.0)
MCV: 90.5 fl (ref 78.0–100.0)
Monocytes Absolute: 0.5 10*3/uL (ref 0.1–1.0)
Monocytes Relative: 9.5 % (ref 3.0–12.0)
Neutro Abs: 2.6 10*3/uL (ref 1.4–7.7)
Neutrophils Relative %: 49 % (ref 43.0–77.0)
Platelets: 341 10*3/uL (ref 150.0–400.0)
RBC: 4.26 Mil/uL (ref 4.22–5.81)
RDW: 13.5 % (ref 11.5–15.5)
WBC: 5.4 10*3/uL (ref 4.0–10.5)

## 2019-04-28 LAB — LIPID PANEL
Cholesterol: 224 mg/dL — ABNORMAL HIGH (ref 0–200)
HDL: 64.6 mg/dL (ref >39.00)
LDL Cholesterol: 138 mg/dL — ABNORMAL HIGH (ref 0–99)
NonHDL: 158.96
Total CHOL/HDL Ratio: 3
Triglycerides: 105 mg/dL (ref 0.0–149.0)
VLDL: 21 mg/dL (ref 0.0–40.0)

## 2019-04-28 MED ORDER — METHYLPHENIDATE HCL 10 MG PO TABS: 10.0000 mg | ORAL_TABLET | Freq: Two times a day (BID) | ORAL | 0 refills | Status: DC

## 2019-04-28 NOTE — Assessment & Plan Note (Signed)
S: In 2020 discovered patient was taking intermittent decongestants and regular Afrin-we recommended discontinuation.  He found a program called rhinostat which helps with weaning from [place] and he has been doing that for 3 weeks and is on lower dose now. He declined ENT when initially discovered.  Has seen Dr. Beryle Beams in the past with hematology.  Home #s 130s to 140s/80s and 90s. Thinks slightly lower off meds. He states  Found out mom had issues as teenager and dad has had BP issues. Mom is on medicine now.  A/P: new diagnosis of hypertension based off home # elevatoins in addition to in office. He agreed to monitor regularly and chart this for [place]. Continue to try to get off afrin, restart exercise. Follow up 1 month to reassess blood pressure as he prefers to try to stay off medicine instead of starting today

## 2019-04-28 NOTE — Telephone Encounter (Signed)
Previous transmission to pharmacy failed. Please resend.

## 2019-04-28 NOTE — Progress Notes (Signed)
Phone [phone]   Subjective:  Benjamin [name] is a 28 y.o. year old very pleasant male patient who presents for/with See problem oriented charting Chief Complaint  Patient presents with  . Follow-up  . White Coat Syndrome  . Hypertension   ROS- No chest pain or shortness of breath. No headache or blurry vision. Still having runny nose and congestion   Past Medical History-  Patient Active Problem List   Diagnosis Date Noted  . Ulcerative colitis ([place]) 04/07/2019    Priority: High  . Factor V Leiden mutation ([place]) 11/28/2016    Priority: High  . Attention deficit disorder (ADD) in adult 05/25/2012    Priority: High  . Essential hypertension 07/31/2018    Priority: Medium  . Asthma     Priority: Medium  . Generalized anxiety disorder 02/20/2013    Priority: Medium    Medications- reviewed and updated Current Outpatient Medications  Medication Sig Dispense Refill  . budesonide (ENTOCORT EC) 3 MG 24 hr capsule TK 3 CS PO QD    . escitalopram (LEXAPRO) 10 MG tablet TAKE 1 TABLET BY MOUTH DAILY 90 tablet 2  . methylphenidate (RITALIN) 10 MG tablet Take 1 tablet (10 mg total) by mouth 2 (two) times daily with breakfast and lunch. May fill today 60 tablet 0  . methylphenidate (RITALIN) 10 MG tablet Take 1 tablet (10 mg total) by mouth 2 (two) times daily with breakfast and lunch. May fill in 1 month 60 tablet 0  . methylphenidate (RITALIN) 10 MG tablet Take 1 tablet (10 mg total) by mouth 2 (two) times daily with breakfast and lunch. May fill in 2 months 60 tablet 0  . triamcinolone cream (KENALOG) 0.1 % Apply 1 application topically 2 (two) times daily. For 7-10 days maximum 80 g 0  . albuterol (PROVENTIL HFA;VENTOLIN HFA) 108 (90 BASE) MCG/ACT inhaler Inhale 2 puffs into the lungs every 6 (six) hours as needed for wheezing. 1 Inhaler 3   No current facility-administered medications for this visit.      Objective:  BP (!) 140/110   Pulse 65   Temp 97.7 F (36.5  C) (Oral)   Ht _0  (1.778 m)   Wt 204 lb 3.2 oz (92.6 kg)   SpO2 99%   BMI 29.30 kg/m  Gen: NAD, resting comfortably CV: RRR no murmurs rubs or gallops Lungs: CTAB no crackles, wheeze, rhonchi Abdomen: soft/nontender/nondistended/normal bowel sounds.  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 Also going back to school at [employer]. Got a puppy  #Hypertension S: In 2020 discovered patient was taking intermittent decongestants and regular Afrin-we recommended discontinuation.  He found a program called rhinostat which helps with weaning from [place] and he has been doing that for 3 weeks and is on lower dose now. He declined ENT when initially discovered.  Has seen Dr. Beryle Beams in the past with hematology.  Home #s 130s to 140s/80s and 90s. Thinks slightly lower off meds. He states  Found out mom had issues as teenager and dad has had BP issues. Mom is on medicine now.  A/P: new diagnosis of hypertension based off home # elevatoins in addition to in office. He agreed to monitor regularly and chart this for [place]. Continue to try to get off afrin, restart exercise. Follow up 1 month to reassess blood pressure as he prefers to try to stay off medicine instead of starting today  #Adult ADD S:  Compliant with Ritalin 20 mg-can take up to twice a day- he has been taking 10 mg twice a day.  -Diagnosed 2011 by Camillia Herter, [facility], [facility] -NCCSRS reviewed today - no high risk use -UDS March 08, 2018 - will update today - Controlled substance contract March 26, 2018 A/P: Stable. Continue current medications.  - we did discuss starting BP meds if bp doesn't come down by next visit- would be very hard with life changes with school and work to stop add meds right now - can call in 3 months for refill and see [place] in 6 months  #Generalized anxiety disorder S: Patient remains on escitalopram 10 mg.  Was not on Zoloft  in the past for depression and GAD-diagnosed in high school-admits several other failures..  Felt mentally slowed on 20 mg Lexapro. A/P:  Reasonable control right now. PHQ9 mildly high- continue current medicine as had worse side effect profile on higher dose and other meds in past   #Asthma S: Mild intermittent.  Rarely uses albuterol A/P: Stable. Continue current medications.     #Factor V Leiden mutation-heterozygous S: Patient does not want lifelong anticoagulation unless DVT or PE.  Dr. Beryle Beams actually wrote the prescription for the original test- he has not formally seen patient in the office today-he was sent a copy of the report though. A/P: No evidence of DVT or PE today-continue to monitor    % Crohn's disease-follow-up with Dr. Watt Climes of the [facility] S: Patient was referred to [facility] due to recurrent abdominal issues as well as elevated ESR and CRP.  Formally diagnosed on colonoscopy March 25, 2019.  He was recently started onbudesonide in 2020 by Dr. Watt Climes A/P: thankful patient asked for evaluation previously as states GI symptoms under better control now.    #Labs needed- mild platelet elevation in October-repeat today.  Mild AST elevation in the past-repeat LFTs today.  Update urine drug screen today.  Recommended follow up: 10-monthADD follow-up  Lab/Order associations: fasting   ICD-10-CM   1. Attention deficit disorder (ADD) in adult  F98.8 CBC with Differential/Platelet    Comprehensive metabolic panel  2. White coat syndrome without hypertension  R03.0 CBC with Differential/Platelet    Comprehensive metabolic panel  3. Factor V Leiden mutation ([place]  D68.51 CBC with Differential/Platelet    Comprehensive metabolic panel  4. Ulcerative colitis without complications, unspecified location ([place]  K51.90 CBC with Differential/Platelet    Comprehensive metabolic panel  5. High risk medication use  Z79.899 Pain Mgmt, Profile 8 w/Conf, U  6. Screening for HIV (human  immunodeficiency virus)  Z11.4 HIV Antibody (routine testing w rflx)  7. Screening for hyperlipidemia  Z13.220 Lipid panel  8. Essential hypertension  I10    Meds ordered this encounter  Medications  . methylphenidate (RITALIN) 10 MG tablet    Sig: Take 1 tablet (10 mg total) by mouth 2 (two) times daily with breakfast and lunch. May fill today    Dispense:  60 tablet    Refill:  0  . methylphenidate (RITALIN) 10 MG tablet    Sig: Take 1 tablet (10 mg total) by mouth 2 (two) times daily with breakfast and lunch. May fill in 1 month    Dispense:  60 tablet    Refill:  0  . methylphenidate (RITALIN) 10 MG tablet    Sig: Take 1 tablet (10 mg total) by mouth 2 (two) times daily with breakfast and lunch. May fill in 2 months  Dispense:  60 tablet    Refill:  0    Return precautions advised.  Garret Reddish, MD

## 2019-04-28 NOTE — Patient Instructions (Addendum)
Health Maintenance Due  Topic Date Due  . HIV Screening-one-time lifetime screening with labs today 03/09/2007    Please stop by lab before you go If you do not have mychart- we will call you about results within 5 business days of [place] receiving them.  If you have mychart- we will send your results within 3 business days of [place] receiving them.  If abnormal or we want to clarify a result, we will call or mychart you to make sure you receive the message.  If you have questions or concerns or don't hear within 5-7 days, please send [place] a message or call us.    Refilled at 51m ritalin dose since that has been effective  1-2 month blood pressure check Try to bring excel sheet or at least a list of blood pressures

## 2019-04-30 LAB — PAIN MGMT, PROFILE 8 W/CONF, U
6 Acetylmorphine: NEGATIVE ng/mL
Alcohol Metabolites: POSITIVE ng/mL — AB (ref <500)
Amphetamines: NEGATIVE ng/mL
Benzodiazepines: NEGATIVE ng/mL
Buprenorphine, Urine: NEGATIVE ng/mL
Cocaine Metabolite: NEGATIVE ng/mL
Creatinine: 238.2 mg/dL
Ethyl Glucuronide (ETG): 219105 ng/mL
Ethyl Sulfate (ETS): >40000 ng/mL
MDMA: NEGATIVE ng/mL
Marijuana Metabolite: NEGATIVE ng/mL
Opiates: NEGATIVE ng/mL
Oxidant: NEGATIVE ug/mL
Oxycodone: NEGATIVE ng/mL
pH: 5.7 (ref 4.5–9.0)

## 2019-04-30 LAB — HIV ANTIBODY (ROUTINE TESTING W REFLEX): HIV 1&2 Ab, 4th Generation: NONREACTIVE

## 2019-06-29 ENCOUNTER — Ambulatory Visit: Payer: BC Managed Care – PPO | Admitting: Family Medicine

## 2019-07-04 ENCOUNTER — Telehealth: Payer: Self-pay

## 2019-07-04 NOTE — Telephone Encounter (Signed)
PA required for Methylphenidate 10 mg tab, qty 60  [id number] ID [id number]  Bin [id number] Grp [id number]

## 2019-07-04 NOTE — Telephone Encounter (Signed)
Initiated via [email or web address]  Benjamin [name] (Key: [id number])  Your information has been submitted to [place]. [employer] [place] will review the request and fax you a determination directly, typically within 3 business days of your submission once all necessary information is received.  If [employer] [place] has not responded in 3 business days or if you have any questions about your submission, contact [place] at [phone].

## 2019-08-04 ENCOUNTER — Other Ambulatory Visit: Payer: Self-pay

## 2019-08-04 ENCOUNTER — Encounter: Payer: Self-pay | Admitting: Family Medicine

## 2019-08-04 ENCOUNTER — Ambulatory Visit (INDEPENDENT_AMBULATORY_CARE_PROVIDER_SITE_OTHER): Payer: 59 | Admitting: Family Medicine

## 2019-08-04 VITALS — BP 134/78 | HR 92 | Temp 98.1°F | Ht 70.0 in | Wt 208.8 lb

## 2019-08-04 DIAGNOSIS — F411 Generalized anxiety disorder: Secondary | ICD-10-CM

## 2019-08-04 DIAGNOSIS — D6851 Activated protein C resistance: Secondary | ICD-10-CM | POA: Diagnosis not present

## 2019-08-04 DIAGNOSIS — J4520 Mild intermittent asthma, uncomplicated: Secondary | ICD-10-CM

## 2019-08-04 DIAGNOSIS — R030 Elevated blood-pressure reading, without diagnosis of hypertension: Secondary | ICD-10-CM

## 2019-08-04 DIAGNOSIS — F988 Other specified behavioral and emotional disorders with onset usually occurring in childhood and adolescence: Secondary | ICD-10-CM

## 2019-08-04 DIAGNOSIS — Z23 Encounter for immunization: Secondary | ICD-10-CM | POA: Diagnosis not present

## 2019-08-04 MED ORDER — METHYLPHENIDATE HCL 10 MG PO TABS: 10.0000 mg | ORAL_TABLET | Freq: Two times a day (BID) | ORAL | 0 refills | Status: DC

## 2019-08-04 NOTE — Patient Instructions (Addendum)
Health Maintenance Due  Topic Date Due  . INFLUENZA VACCINE  Will get today  05/07/2019   Glad blood pressure is doing so well at home- keep checking at least twice a week as long as remains <140/90  Can also stay on ritalin 48m twice a day as long as BP <140/90  Contact me in 3 months by mychart for refill actually a week or two before you run out and we can refill as long as blood pressure still doing well at home  If you feel like [facility] [employer] you need higher dose- schedule follow up to discuss

## 2019-08-04 NOTE — Progress Notes (Signed)
Phone [phone]  Subjective:  In person visit Benjamin [name] is a 28 y.o. year old very pleasant male patient who presents for/with See problem oriented charting Chief Complaint  Patient presents with  . Follow-up     ROS- Review of Systems  All other systems reviewed and are negative. Anxiety improved.  No chest pain or shortness of breath reported.  No edema.  Past Medical History-  Patient Active Problem List   Diagnosis Date Noted  . Ulcerative colitis ([place]) 04/07/2019    Priority: High  . Factor V Leiden mutation ([place]) 11/28/2016    Priority: High  . Attention deficit disorder (ADD) in adult 05/25/2012    Priority: High  . White coat syndrome without diagnosis of hypertension 07/31/2018    Priority: Medium  . Asthma     Priority: Medium  . Generalized anxiety disorder 02/20/2013    Priority: Medium    Medications- reviewed and updated Current Outpatient Medications  Medication Sig Dispense Refill  . budesonide (ENTOCORT EC) 3 MG 24 hr capsule TK 3 CS PO QD    . escitalopram (LEXAPRO) 10 MG tablet TAKE 1 TABLET BY MOUTH DAILY 90 tablet 2  . methylphenidate (RITALIN) 10 MG tablet Take 1 tablet (10 mg total) by mouth 2 (two) times daily with breakfast and lunch. May fill in 2 months 60 tablet 0  . methylphenidate (RITALIN) 10 MG tablet Take 1 tablet (10 mg total) by mouth 2 (two) times daily. 60 tablet 0  . [START ON 09/03/2019] methylphenidate (RITALIN) 10 MG tablet Take 1 tablet (10 mg total) by mouth 2 (two) times daily. 60 tablet 0  . [START ON 10/03/2019] methylphenidate (RITALIN) 10 MG tablet Take 1 tablet (10 mg total) by mouth 2 (two) times daily. 60 tablet 0   No current facility-administered medications for this visit.      Objective:  BP 134/78   Pulse 92   Temp 98.1 F (36.7 C) (Temporal)   Ht _0  (1.778 m)   Wt 208 lb 12.8 oz (94.7 kg)   SpO2 96%   BMI 29.96 kg/m  Gen: NAD, resting comfortably CV: RRR no murmurs rubs or gallops  Lungs: CTAB no crackles, wheeze, rhonchi Ext: no edema Skin: warm, dry    Assessment and Plan   #Social update starting [employer] January 2020- concerned he may need more ADD med help.   #White Coat Syndrome without hypertension S: In 2020 discovered patient was taking intermittent decongestants and regular Afrin-we recommended discontinuation.  He declined ENT when initially discovered.  A/P: Luckily blood pressure is much improved.  Patient has been using Afrin much less frequently and that may contribute to improvement. -ADD medicine is so important for patient's functioning that he would be open to considering low-dose amlodipine if needed in the future  #Adult ADD S: Compliant with Ritalin 10 mg twice daily. -Diagnosed 2011 by Benjamin Herter, [place], [facility] -NCCSRS reviewed today - no high risk use -UDS  April 28, 2019 - Controlled substance contract March 26, 2018 A/P: Well-controlled today-refill medication.  Since blood pressure is better we can space visits out to 6 months.  Would need in person office visit if need to increase dose-as above "starting [employer] January 2020- concerned he may need more ADD med help."  #Generalized anxiety disorder S: Patient remains on escitalopram 10 mg.  Was not on Zoloft in the past for depression and GAD-diagnosed in high school-admits several other failures..  Felt mentally slowed on 20  mg Lexapro. 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  Not using as of 2020  #Factor V Leiden mutation-heterozygous S: Patient does not want lifelong anticoagulation unless DVT or PE.  Dr. Beryle [name] actually wrote the prescription for the original test- he has not formally seen patient in the office today-he was sent a copy of the report though. A/P: No evidence of DVT or PE today in his history -continue to monitor    %  Crohn's disease-follow-up with Benjamin [name] of the [facility] S: Patient was referred to [facility] due to recurrent abdominal issues as well as elevated ESR and CRP.  Formally diagnosed on colonoscopy March 25, 2019. He was recently started on budesonide-states has been able to wean down somewhat A/P: Appears to be improving-continue close GI follow-up  Recommended follow up: Likely 6 months  Lab/Order associations:   ICD-10-CM   1. Attention deficit disorder (ADD) in adult  F98.8   2. Need for immunization against influenza  Z23 Flu Vaccine QUAD 36+ mos IM  3. Generalized anxiety disorder  F41.1   4. Factor V Leiden mutation ([place])  D68.51   5. White coat syndrome without diagnosis of hypertension  R03.0   6. Mild intermittent asthma without complication  Q60.47     Meds ordered this encounter  Medications  . methylphenidate (RITALIN) 10 MG tablet    Sig: Take 1 tablet (10 mg total) by mouth 2 (two) times daily.    Dispense:  60 tablet    Refill:  0  . methylphenidate (RITALIN) 10 MG tablet    Sig: Take 1 tablet (10 mg total) by mouth 2 (two) times daily.    Dispense:  60 tablet    Refill:  0  . methylphenidate (RITALIN) 10 MG tablet    Sig: Take 1 tablet (10 mg total) by mouth 2 (two) times daily.    Dispense:  60 tablet    Refill:  0    Return precautions advised.  Benjamin Reddish, MD

## 2019-08-08 ENCOUNTER — Ambulatory Visit: Payer: BC Managed Care – PPO | Admitting: Family Medicine

## 2019-09-16 ENCOUNTER — Other Ambulatory Visit: Payer: Self-pay | Admitting: Family Medicine

## 2019-11-10 ENCOUNTER — Telehealth: Payer: Self-pay

## 2019-11-10 NOTE — Telephone Encounter (Signed)
Patient have enough to last him until Saturday 11/12/19

## 2019-11-10 NOTE — Telephone Encounter (Signed)
MEDICATION:methylphenidate (RITALIN) 10 MG tablet  PHARMACY: [facility] [place], [address] Phone:  [phone]  Fax:  [phone]       Comments:   **Let patient know to contact pharmacy at the end of the day to make sure medication is ready. **  ** Please notify patient to allow 48-72 hours to process**  **Encourage patient to contact the pharmacy for refills or they can request refills through [facility]**

## 2019-11-11 NOTE — Telephone Encounter (Signed)
Error

## 2019-11-11 NOTE — Telephone Encounter (Signed)
Called l/m to call office last app was 08/04/2019. We will need to make app soon. Ok to refill?

## 2019-11-14 ENCOUNTER — Encounter: Payer: Self-pay | Admitting: Family Medicine

## 2019-11-14 ENCOUNTER — Ambulatory Visit (INDEPENDENT_AMBULATORY_CARE_PROVIDER_SITE_OTHER): Payer: 59 | Admitting: Family Medicine

## 2019-11-14 VITALS — BP 130/80 | Ht 70.0 in | Wt 200.0 lb

## 2019-11-14 DIAGNOSIS — R030 Elevated blood-pressure reading, without diagnosis of hypertension: Secondary | ICD-10-CM

## 2019-11-14 DIAGNOSIS — K5190 Ulcerative colitis, unspecified, without complications: Secondary | ICD-10-CM | POA: Diagnosis not present

## 2019-11-14 DIAGNOSIS — J4520 Mild intermittent asthma, uncomplicated: Secondary | ICD-10-CM

## 2019-11-14 DIAGNOSIS — F411 Generalized anxiety disorder: Secondary | ICD-10-CM | POA: Diagnosis not present

## 2019-11-14 DIAGNOSIS — F988 Other specified behavioral and emotional disorders with onset usually occurring in childhood and adolescence: Secondary | ICD-10-CM | POA: Diagnosis not present

## 2019-11-14 DIAGNOSIS — D6851 Activated protein C resistance: Secondary | ICD-10-CM

## 2019-11-14 MED ORDER — METHYLPHENIDATE HCL 10 MG PO TABS: 10.0000 mg | ORAL_TABLET | Freq: Three times a day (TID) | ORAL | 0 refills | Status: DC

## 2019-11-14 NOTE — Patient Instructions (Signed)
There are no preventive care reminders to display for this patient.  Depression screen [facility] 2/9 08/04/2019 04/28/2019 03/08/2018  Decreased Interest 0 1 0  Down, Depressed, Hopeless 0 1 0  PHQ - 2 Score 0 2 0  Altered sleeping 0 2 1  Tired, decreased energy 0 2 2  Change in appetite 0 0 0  Feeling bad or failure about yourself  0 0 1  Trouble concentrating 0 0 0  Moving slowly or fidgety/restless 0 0 0  Suicidal thoughts 0 0 0  PHQ-9 Score 0 6 4  Difficult doing work/chores Not difficult at all Not difficult at all Somewhat difficult    Recommended follow up: No follow-ups on file.

## 2019-11-14 NOTE — Progress Notes (Signed)
Phone [phone] Virtual visit via Video note   Subjective:  Chief complaint: Chief Complaint  Patient presents with  . ADHD   This visit type was conducted due to national recommendations for restrictions regarding the COVID-19 Pandemic (e.g. social distancing).  This format is felt to be most appropriate for this patient at this time balancing risks to patient and risks to population by having him in for in person visit.  No physical exam was performed (except for noted visual exam or audio findings with Telehealth visits).    Our team/I connected with Benjamin [name] at  9:20 AM EST by a video enabled telemedicine application (doxy.me or caregility through epic) and verified that I am speaking with the correct person using two identifiers.  Location patient: [address], [address]  Location provider: [facility], office Persons participating in the virtual visit:  patient  Our team/I discussed the limitations of evaluation and management by telemedicine and the availability of in person appointments. In light of current covid-19 pandemic, patient also understands that we are trying to protect them by minimizing in office contact if at all possible.  The patient expressed consent for telemedicine visit and agreed to proceed. Patient understands insurance will be billed.   Past Medical History-  Patient Active Problem List   Diagnosis Date Noted  . Ulcerative colitis ([place]) 04/07/2019    Priority: High  . Factor V Leiden mutation ([place]) 11/28/2016    Priority: High  . Attention deficit disorder (ADD) in adult 05/25/2012    Priority: High  . White coat syndrome without diagnosis of hypertension 07/31/2018    Priority: Medium  . Asthma     Priority: Medium  . Generalized anxiety disorder 02/20/2013    Priority: Medium    Medications- reviewed and updated Current Outpatient Medications  Medication Sig Dispense Refill  . budesonide (ENTOCORT EC) 3 MG 24 hr capsule TK 3 CS PO QD      . escitalopram (LEXAPRO) 10 MG tablet TAKE 1 TABLET BY MOUTH DAILY 90 tablet 2  . methylphenidate (RITALIN) 10 MG tablet Take 1 tablet (10 mg total) by mouth 2 (two) times daily with breakfast and lunch. May fill in 2 months 60 tablet 0  . methylphenidate (RITALIN) 10 MG tablet Take 1 tablet (10 mg total) by mouth 3 (three) times daily with meals. Fill in 2 months 90 tablet 0  . [START ON 12/14/2019] methylphenidate (RITALIN) 10 MG tablet Take 1 tablet (10 mg total) by mouth 3 (three) times daily with meals. Fill in 1 month 90 tablet 0  . [START ON 01/13/2020] methylphenidate (RITALIN) 10 MG tablet Take 1 tablet (10 mg total) by mouth 3 (three) times daily with meals. May fill today 90 tablet 0   No current facility-administered medications for this visit.     Objective:  BP 130/80   Ht 5' 10"  (1.778 m)   Wt 200 lb (90.7 kg)   BMI 28.70 kg/m  self reported vitals Gen: NAD, resting comfortably Lungs: nonlabored, normal respiratory rate  Skin: appears dry, no obvious rash     Assessment and Plan  #social update- Full time at [employer] for business. Working full time in [employer] production planner.   #White Coat Syndrome without hypertension S: In 2020 discovered patient was taking intermittent decongestants and regular Afrin-we recommended discontinuation- still using afrin twice a day but luckily BP still is controlled.  He declined ENT when initially discovered.  A/P: controlled today but still recommended no afrin on regular  basis!   #Adult ADD S: Compliant with Ritalin 10 mg twice daily. Depression with extended release version in the past.  Poor control with current dose with demands of full time work plus full time school. 3-4 hours of benefit -Diagnosed 2011 by Camillia Herter, [place], [facility] -NCCSRS reviewed today - no high risk use -UDS April 28, 2019 - Controlled substance contract March 26, 2018 Since the last visit has the patient  had any:  Appetite changes? No Unintentional weight loss? No Is medication working well ? Yes but having issues with longer days with school and work combo  Does patient take drug holidays? No Difficulties falling to sleep or maintaining sleep? No Any anxiety?  Not above basesline Any cardiac issues (fainting or paliptations)? No Suicidal thoughts? No Changes in health since last visit? No New medications? No Any illicit substance abuse? No Has the patient taken his medication today? Yes A/P:  Good control when medicine in system for 3-4 hours but poor control considering his days are prolonged with work/school combo now. We will increase to three times a day as needed- he can certainly skip dose on shorter days -Since blood pressure has done so well lately-we opted for 16-monthphysical-we can refill medication in 3 months through my chart message or phone call  #Generalized anxiety disorder S: good control. Patient remains on escitalopram 10 mg.  Was not on Zoloft in the past for depression and GAD-diagnosed in high school-admits several other failures..  Felt mentally slowed on 20 mg Lexapro. He feels like the medication  is working well.  A/P: reasonably control- continue current meds. No SI   #Asthma-rare albuterol in the past with exercise.  Not using as of 2021- not exercising as much as he would like  #Factor V Leiden mutation-heterozygous S: Patient does not want lifelong anticoagulation unless DVT or PE.  Dr. GBeryle Beamsactually wrote the prescription for the original test- he has not formally seen patient in the office today-he was sent a copy of the report though. A/P: No evidence of DVT or PE today-continue to monitor.    % Crohn's disease-[facility] as well as elevated ESR and CRP.  Formally diagnosed on colonoscopy March 25, 2019. He was recently started on budesonide  A/P:   He thinks he is due for follow up - wants to see if he can transition off of budesonide but one of other options made him have diarrhea so he is going to explore other options   Recommended follow up:  6 months for physical No future appointments. Lab/Order associations:   ICD-10-CM   1. Attention deficit disorder (ADD) in adult  F98.8   2. Generalized anxiety disorder  F41.1   3. White coat syndrome without diagnosis of hypertension  R03.0   4. Ulcerative colitis without complications, unspecified location ([place]  K51.90   5. Factor V Leiden mutation ([place]  D68.51   6. Mild intermittent asthma without complication  JN36.14   Return precautions advised.  SGarret Reddish MD

## 2019-11-14 NOTE — Assessment & Plan Note (Signed)
S: Compliant with Ritalin 10 mg twice daily. Depression with extended release version in the past.  Poor control with current dose with demands of full time work plus full time school. 3-4 hours of benefit -Diagnosed 2011 by Camillia Herter, [place], license [facility] -NCCSRS reviewed today - no high risk use -UDS April 28, 2019 - Controlled substance contract March 26, 2018 Since the last visit has the patient had any:  Appetite changes? No Unintentional weight loss? No Is medication working well ? Yes but having issues with longer days with school and work combo  Does patient take drug holidays? No Difficulties falling to sleep or maintaining sleep? No Any anxiety?  Not above basesline Any cardiac issues (fainting or paliptations)? No Suicidal thoughts? No Changes in health since last visit? No New medications? No Any illicit substance abuse? No Has the patient taken his medication today? Yes A/P:  Good control when medicine in system for 3-4 hours but poor control considering his days are prolonged with work/school combo now. We will increase to three times a day as needed- he can certainly skip dose on shorter days -Since blood pressure has done so well lately-we opted for 60-monthphysical-we can refill medication in 3 months through my chart message or phone call

## 2019-11-15 ENCOUNTER — Telehealth: Payer: Self-pay | Admitting: Family Medicine

## 2019-11-15 NOTE — Telephone Encounter (Signed)
Called and submitted PA for pt. Confirmation #: [id number]. Called and lm on pt vm making him aware.

## 2019-11-15 NOTE — Telephone Encounter (Signed)
Pt called stating pharmacy told him insurance needed to authorize prescription for Ritalin 10 MG Tablet. Please advise.

## 2020-01-18 ENCOUNTER — Ambulatory Visit: Payer: 59 | Attending: Internal Medicine

## 2020-01-18 DIAGNOSIS — Z20822 Contact with and (suspected) exposure to covid-19: Secondary | ICD-10-CM

## 2020-01-19 LAB — SARS-COV-2, NAA 2 DAY TAT

## 2020-01-19 LAB — NOVEL CORONAVIRUS, NAA: SARS-CoV-2, NAA: NOT DETECTED

## 2020-01-30 ENCOUNTER — Ambulatory Visit: Payer: 59 | Attending: Internal Medicine

## 2020-01-30 DIAGNOSIS — Z20822 Contact with and (suspected) exposure to covid-19: Secondary | ICD-10-CM

## 2020-01-31 LAB — NOVEL CORONAVIRUS, NAA: SARS-CoV-2, NAA: NOT DETECTED

## 2020-01-31 LAB — SARS-COV-2, NAA 2 DAY TAT

## 2020-04-06 ENCOUNTER — Other Ambulatory Visit: Payer: Self-pay | Admitting: Family Medicine

## 2020-04-06 NOTE — Telephone Encounter (Signed)
Patient called and stated he is running low on  RITALIN and tried to get an appt but couldn't get him in soon enough he was wondering if he could get it refilled with out talking to dr hunter or I can book him for a same day slot or get him in with another provider  Please advise ?   Marland KitchenMarland Kitchen LAST APPOINTMENT DATE: 11/15/2019   NEXT APPOINTMENT DATE:@Visit  date not found  MEDICATION:methylphenidate (RITALIN) 10 MG tablet  PHARMACY:[id number] - [place], [place] - [address] AT [place]  **Let patient know to contact pharmacy at the end of the day to make sure medication is ready. **  ** Please notify patient to allow 48-72 hours to process**  **Encourage patient to contact the pharmacy for refills or they can request refills through [facility]**  CLINICAL FILLS OUT ALL BELOW:   LAST REFILL:  QTY:  REFILL DATE:    OTHER COMMENTS:    Okay for refill?  Please advise

## 2020-04-10 ENCOUNTER — Telehealth: Payer: Self-pay

## 2020-04-10 MED ORDER — METHYLPHENIDATE HCL 10 MG PO TABS: 10.0000 mg | ORAL_TABLET | Freq: Three times a day (TID) | ORAL | 0 refills | Status: DC

## 2020-04-10 NOTE — Telephone Encounter (Signed)
Can load up for refill and send to me- he was seen in February and did not request any refills yet so we can do 3 more months- but have him go ahead and schedule for next visit

## 2020-04-10 NOTE — Telephone Encounter (Signed)
See below

## 2020-04-10 NOTE — Telephone Encounter (Signed)
Sent to Dr. Yong Channel for approval.

## 2020-04-10 NOTE — Telephone Encounter (Signed)
..   LAST APPOINTMENT DATE: 04/06/2020   NEXT APPOINTMENT DATE:@Visit  date not found  MEDICATION:methylphenidate (RITALIN) 10 MG tablet   PHARMACY:  **Let patient know to contact pharmacy at the end of the day to make sure medication is ready. **  ** Please notify patient to allow 48-72 hours to process**  **Encourage patient to contact the pharmacy for refills or they can request refills through [facility]**  CLINICAL FILLS OUT ALL BELOW:   LAST REFILL:  QTY:  REFILL DATE:    OTHER COMMENTS:    Okay for refill?  Please advise

## 2020-04-10 NOTE — Telephone Encounter (Signed)
Rx loaded and sent to Dr. Yong Channel

## 2020-06-04 ENCOUNTER — Telehealth: Payer: Self-pay | Admitting: Family Medicine

## 2020-06-04 ENCOUNTER — Other Ambulatory Visit: Payer: Self-pay | Admitting: Family Medicine

## 2020-06-04 ENCOUNTER — Other Ambulatory Visit: Payer: Self-pay

## 2020-06-04 MED ORDER — ESCITALOPRAM OXALATE 10 MG PO TABS: 10.0000 mg | ORAL_TABLET | Freq: Every day | ORAL | 2 refills | Status: DC

## 2020-06-04 NOTE — Telephone Encounter (Signed)
..   LAST APPOINTMENT DATE: 06/04/2020   NEXT APPOINTMENT DATE:@Visit  date not found  MEDICATION:escitalopram (LEXAPRO) 10 MG tablet  PHARMACY:[id number] - [place], [place] - [address] AT [place]  **Let patient know to contact pharmacy at the end of the day to make sure medication is ready. **  ** Please notify patient to allow 48-72 hours to process**  **Encourage patient to contact the pharmacy for refills or they can request refills through [facility]**  CLINICAL FILLS OUT ALL BELOW:   LAST REFILL:  QTY:  REFILL DATE:    OTHER COMMENTS:  Patient is traveling to [place] in 2 days and would like to have this refilled before he leaves   Okay for refill?  Please advise

## 2020-06-04 NOTE — Telephone Encounter (Signed)
Refill called in. 

## 2020-07-17 ENCOUNTER — Telehealth: Payer: 59 | Admitting: Family Medicine

## 2020-07-30 ENCOUNTER — Other Ambulatory Visit: Payer: Self-pay

## 2020-07-30 NOTE — Telephone Encounter (Signed)
.   LAST APPOINTMENT DATE: 06/04/2020   NEXT APPOINTMENT DATE:@Visit  date not found  MEDICATION:methylphenidate (RITALIN) 10 MG tablet  PHARMACY:[id number] - [place], [place] - [address] AT [place]  **Let patient know to contact pharmacy at the end of the day to make sure medication is ready. **  ** Please notify patient to allow 48-72 hours to process**  **Encourage patient to contact the pharmacy for refills or they can request refills through [facility]**  CLINICAL FILLS OUT ALL BELOW:   LAST REFILL:  QTY:  REFILL DATE:    OTHER COMMENTS:    Okay for refill?  Please advise

## 2020-07-30 NOTE — Telephone Encounter (Signed)
I am confused- they are loading script for refill in 2 months. He will need the more current rx refill I assume

## 2020-07-30 NOTE — Telephone Encounter (Signed)
Last refill: 04/10/20 #90, 0 Last OV:11/14/19 dx. ADD

## 2020-07-30 NOTE — Telephone Encounter (Signed)
Refill request sent to pharmacy

## 2020-07-31 MED ORDER — METHYLPHENIDATE HCL 10 MG PO TABS: 10.0000 mg | ORAL_TABLET | Freq: Three times a day (TID) | ORAL | 0 refills | Status: DC

## 2020-07-31 NOTE — Addendum Note (Signed)
Addended by: Thomes Cake on: 07/31/2020 01:51 PM   Modules accepted: Orders

## 2020-07-31 NOTE — Telephone Encounter (Signed)
Refill has been sent to the pharmacy.

## 2020-07-31 NOTE — Telephone Encounter (Signed)
Was this refilled? Pt is calling to ask for update.

## 2020-09-05 NOTE — Progress Notes (Signed)
Phone [phone] In person visit   Subjective:   Benjamin [name] is a 29 y.o. year old very pleasant male patient who presents for/with See problem oriented charting Chief Complaint  Patient presents with  . Ritalin    refill  . Labs Only    pt states that you may want to do lab work he's unsure  . Flu Vaccine    patient wants flu shot today    This visit occurred during the SARS-CoV-2 public health emergency.  Safety protocols were in place, including screening questions prior to the visit, additional usage of staff PPE, and extensive cleaning of exam room while observing appropriate contact time as indicated for disinfecting solutions.   Past Medical History-  Patient Active Problem List   Diagnosis Date Noted  . Ulcerative colitis ([place]) 04/07/2019    Priority: High  . Factor V Leiden mutation ([place]) 11/28/2016    Priority: High  . Attention deficit disorder (ADD) in adult 05/25/2012    Priority: High  . White coat syndrome without diagnosis of hypertension 07/31/2018    Priority: Medium  . Asthma     Priority: Medium  . Generalized anxiety disorder 02/20/2013    Priority: Medium    Medications- reviewed and updated Current Outpatient Medications  Medication Sig Dispense Refill  . escitalopram (LEXAPRO) 10 MG tablet Take 1 tablet (10 mg total) by mouth daily. 90 tablet 2  . methylphenidate (RITALIN) 10 MG tablet Take 1 tablet (10 mg total) by mouth 3 (three) times daily with meals. May fill today 90 tablet 0  . methylphenidate (RITALIN) 10 MG tablet Take 1 tablet (10 mg total) by mouth 3 (three) times daily with meals. Fill in 2 months 90 tablet 0  . methylphenidate (RITALIN) 10 MG tablet Take 1 tablet (10 mg total) by mouth 3 (three) times daily with meals. Fill in 1 month 90 tablet 0   No current facility-administered medications for this visit.     Objective:  BP (!) 150/102   Pulse (!) 108   Temp 98 F (36.7 C) (Temporal)   Ht 5' 10" (1.778 m)   Wt  209 lb 9.6 oz (95.1 kg)   SpO2 97%   BMI 30.07 kg/m  Gen: NAD, resting comfortably CV: RRR no murmurs rubs or gallops (gets anxious in visits typically and usually at least in 90s on HR with me here) Lungs: CTAB no crackles, wheeze, rhonchi Abdomen: soft/nontender/nondistended/normal bowel sounds.  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  He declined ENT when initially discovered.   A/P: poor control today but typically controlled at home. has not been checking as much at home lately- he agrees to restart checking blood pressure and let me know. With ritalin we obviously want to be cautious with blood pressure.  -also needs to stop afrin for blood pressure concern - needs to restart exercise  #Adult ADD S: Compliant with Ritalin 10 mg three times daily but may skip evening dose if needed or if has less schoolwork can take half- still sleeping ok. Depression with extended release version in the past.  -Diagnosed 2011 by Camillia Herter, [place], license [facility] -[place] reviewed today - no high risk use -UDS  April 28, 2019 - Controlled substance contract March 26, 2018 A/P:  Reasonable control but had to go up to 3x a day on Ritalin- doing well with this- refill at this dose and follow up in 6 months for physical- asked him to go ahead and schedule today. In 3 months we can do a refill by mychart or phone call   #Generalized anxiety disorder S: Patient remains on escitalopram 10 mg.  Was not on Zoloft in the past for depression and GAD-diagnosed in high school-admits several other failures..  Felt mentally slowed on 20 mg Lexapro. A/P: reasonable control-  continue current meds   #Factor V Leiden mutation-heterozygous S: Patient does not want lifelong anticoagulation unless DVT or PE.  Dr. Beryle Beams actually wrote the prescription for the original test- he has not formally seen patient in the office today-he was sent a copy of the report though. A/P: appears stable. No evidence of DVT or PE today-continue to monitor -unfortunately mother with another blood clot recenlty and will be on lifelong anticoagulation  % Crohn's disease-follow-up with Dr. Watt Climes of the [facility] S: Patient was referred to [facility] due to recurrent abdominal issues as well as elevated ESR and CRP.  Formally diagnosed on colonoscopy March 25, 2019. He has been fine without medicine recently A/P: reasonable control- advised continued follow upw ith Dr. Watt Climes   Recommended follow up: Return in about 6 months (around 03/07/2021) for physical or sooner if needed.  Lab/Order associations:   ICD-10-CM   1. Attention deficit disorder (ADD) in adult  F98.8 DRUG MONITORING, PANEL 8 WITH CONFIRMATION, URINE  2. Generalized anxiety disorder  F41.1   3. Factor V Leiden mutation ([place])  D68.51   4. High risk medication use  Z79.899 DRUG MONITORING, PANEL 8 WITH CONFIRMATION, URINE  5. Ulcerative colitis without complications, unspecified location ([place])  K51.90   6. White coat syndrome without diagnosis of hypertension  R03.0     Meds ordered this encounter  Medications  . methylphenidate (RITALIN) 10 MG tablet    Sig: Take 1 tablet (10 mg total) by mouth 3 (three) times daily with meals. May fill today    Dispense:  90 tablet    Refill:  0  . methylphenidate (RITALIN) 10 MG tablet    Sig: Take 1 tablet (10 mg total) by mouth 3 (three) times daily with meals. Fill in 2 months    Dispense:  90 tablet    Refill:  0  . methylphenidate (RITALIN) 10 MG tablet    Sig: Take 1 tablet (10 mg total) by mouth 3 (three) times daily with meals. Fill in 1 month    Dispense:  90 tablet     Refill:  0   Return precautions advised.  Garret Reddish, MD

## 2020-09-05 NOTE — Patient Instructions (Addendum)
Health Maintenance Due  Topic Date Due  . INFLUENZA VACCINE - today 05/06/2020   has not been checking as much at home lately- he agrees to restart checking blood pressure and let me know. With ritalin we obviously want to be cautious with blood pressure.  -also needs to stop afrin for blood pressure concern  Reasonable control but had to go up to 3x a day on Ritalin- doing well with this- refill at this dose and follow up in 6 months for physical- asked him to go ahead and schedule today. In 3 months we can do a refill by mychart or phone call   Please stop by lab before you go If you have mychart- we will send your results within 3 business days of [place] receiving them.  If you do not have mychart- we will call you about results within 5 business days of [place] receiving them.  *please note we are currently using [facility] which has a longer processing time than [place] typically so labs may not come back as quickly as in the past *please also note that you will see labs on mychart as soon as they post. I will later go in and write notes on them- will say "notes from Dr. Yong Channel"

## 2020-09-06 ENCOUNTER — Telehealth: Payer: 59 | Admitting: Family Medicine

## 2020-09-06 ENCOUNTER — Other Ambulatory Visit: Payer: Self-pay

## 2020-09-06 ENCOUNTER — Encounter: Payer: Self-pay | Admitting: Family Medicine

## 2020-09-06 ENCOUNTER — Ambulatory Visit (INDEPENDENT_AMBULATORY_CARE_PROVIDER_SITE_OTHER): Payer: 59 | Admitting: Family Medicine

## 2020-09-06 VITALS — BP 150/102 | HR 108 | Temp 98.0°F | Ht 70.0 in | Wt 209.6 lb

## 2020-09-06 DIAGNOSIS — F411 Generalized anxiety disorder: Secondary | ICD-10-CM

## 2020-09-06 DIAGNOSIS — Z79899 Other long term (current) drug therapy: Secondary | ICD-10-CM | POA: Diagnosis not present

## 2020-09-06 DIAGNOSIS — Z1159 Encounter for screening for other viral diseases: Secondary | ICD-10-CM

## 2020-09-06 DIAGNOSIS — F988 Other specified behavioral and emotional disorders with onset usually occurring in childhood and adolescence: Secondary | ICD-10-CM

## 2020-09-06 DIAGNOSIS — Z23 Encounter for immunization: Secondary | ICD-10-CM

## 2020-09-06 DIAGNOSIS — R030 Elevated blood-pressure reading, without diagnosis of hypertension: Secondary | ICD-10-CM

## 2020-09-06 DIAGNOSIS — D6851 Activated protein C resistance: Secondary | ICD-10-CM

## 2020-09-06 DIAGNOSIS — K5190 Ulcerative colitis, unspecified, without complications: Secondary | ICD-10-CM

## 2020-09-06 MED ORDER — METHYLPHENIDATE HCL 10 MG PO TABS: 10.0000 mg | ORAL_TABLET | Freq: Three times a day (TID) | ORAL | 0 refills | Status: DC

## 2020-09-12 LAB — DRUG MONITORING, PANEL 8 WITH CONFIRMATION, URINE
6 Acetylmorphine: NEGATIVE ng/mL (ref <10)
Alcohol Metabolites: POSITIVE ng/mL — AB
Amphetamines: NEGATIVE ng/mL (ref <500)
Benzodiazepines: NEGATIVE ng/mL (ref <100)
Buprenorphine, Urine: NEGATIVE ng/mL (ref <5)
Cocaine Metabolite: NEGATIVE ng/mL (ref <150)
Creatinine: 115.3 mg/dL
Ethyl Glucuronide (ETG): 11927 ng/mL — ABNORMAL HIGH (ref <500)
Ethyl Sulfate (ETS): 4087 ng/mL — ABNORMAL HIGH (ref <100)
MDMA: NEGATIVE ng/mL (ref <500)
Marijuana Metabolite: NEGATIVE ng/mL (ref <20)
Opiates: NEGATIVE ng/mL (ref <100)
Oxidant: NEGATIVE ug/mL
Oxycodone: NEGATIVE ng/mL (ref <100)
pH: 5.5 (ref 4.5–9.0)

## 2020-09-12 LAB — DM TEMPLATE

## 2021-01-15 NOTE — Progress Notes (Signed)
[place] Phone: [phone] Subjective:   Benjamin [name], am serving as a scribe for Dr. Hulan [name]. This visit occurred during the SARS-CoV-2 public health emergency.  Safety protocols were in place, including screening questions prior to the visit, additional usage of staff PPE, and extensive cleaning of exam room while observing appropriate contact time as indicated for disinfecting solutions.   I'm seeing this patient by the request  of:  Benjamin Olp, MD  CC: Neck pain  [id number]  Benjamin [name] is a 30 y.o. male coming in with complaint of neck pain. Patient states that 5 years ago he was doing handstand push up when he fell on his cervical spine. Pain in neck occurring intermittently on L side of neck since then. Chiro care made pain worse. Patient does get headaches due to pain. Does still workout but does not do Crossfit.       Past Medical History:  Diagnosis Date  . Allergy   . Anxiety   . Asthma    only before sports  . Depression    Past Surgical History:  Procedure Laterality Date  . none     Social History   Socioeconomic History  . Marital status: Single    Spouse name: Not on file  . Number of children: Not on file  . Years of education: Not on file  . Highest education level: Not on file  Occupational History  . Not on file  Tobacco Use  . Smoking status: Never Smoker  . Smokeless tobacco: Never Used  Substance and Sexual Activity  . Alcohol use: Yes    Alcohol/week: 14.0 standard drinks    Types: 14 Standard drinks or equivalent per week  . Drug use: [name]  . Sexual activity: Not on file  Other Topics Concern  . Not on file  Social History Narrative   Single. Not dating.       Full time at [employer] for business.    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 Penicillins    Family History  Problem Relation Age of Onset  . Hypertension Mother   . Hypertension Father   . Alcoholism Father   . Alcoholism Paternal Grandfather     Current Outpatient Medications (Endocrine & Metabolic):  .  predniSONE (DELTASONE) 50 MG tablet, Take one tablet daily for the next 5 days.    Current Outpatient Medications (Analgesics):  .  meloxicam (MOBIC) 7.5 MG tablet, Take 1 tablet (7.5 mg total) by mouth daily.   Current Outpatient Medications (Other):  .  escitalopram (LEXAPRO) 10 MG tablet, Take 1 tablet (10 mg total) by mouth daily. .  methylphenidate (RITALIN) 10 MG tablet, Take 1 tablet (10 mg total) by mouth 3 (three) times daily with meals. May fill today .  methylphenidate (RITALIN) 10 MG tablet, Take 1 tablet (10 mg total) by mouth 3 (three) times daily with meals. Fill in 2 months .  methylphenidate (RITALIN) 10 MG tablet, Take 1 tablet (10 mg  total) by mouth 3 (three) times daily with meals. Fill in 1 month   Reviewed prior external information including notes and imaging from  primary care provider As well as notes that were available from care everywhere and other healthcare systems.  Past medical history, social, surgical and family history all reviewed in electronic medical record.  [name] pertanent information unless stated regarding to the chief complaint.   Review of Systems:  [name] headache, visual changes, nausea, vomiting, diarrhea, constipation, dizziness, abdominal pain, skin rash, fevers, chills, night sweats, weight loss, swollen lymph nodes, body aches, joint swelling, chest pain, shortness of breath, mood changes. POSITIVE muscle aches  Objective   Blood pressure (!) 164/130, pulse 99, height 5' 10"  (1.778 m), weight 208 lb (94.3 kg), SpO2 98 %.   General: [name] apparent distress alert and oriented x3 mood and affect normal, dressed appropriately.  HEENT: Pupils equal, extraocular movements intact  Respiratory: Patient's speak in full sentences and does not appear short of breath  Cardiovascular: [name] lower extremity edema, non tender, [name] erythema  Gait normal with good balance and coordination.  MSK:  Non tender with full range of motion and good stability and symmetric strength and tone of shoulders, elbows, wrist, hip, knee and ankles bilaterally.  Neck exam does have significant loss of lordosis.  Patient does have limited range of motion lacking the last 10 degrees of flexion in the last 5 degrees of extension.  Patient does have more of a fullness on the left side of the neck.  He does have a tightness.  Negative Spurling's.  5 out of 5 strength of the upper extremities otherwise.  Deep tendon reflexes are intact   [id number]; 15 additional minutes spent for Therapeutic exercises as stated in above notes.  This included exercises focusing on stretching, strengthening, with significant focus on eccentric aspects.   Long term goals include an improvement in range of motion, strength, endurance as well as avoiding reinjury. Patient's frequency would include in 1-2 times a day, 3-5 times a week for a duration of 6-12 weeks. Exercises that included:  Basic scapular stabilization to include adduction and depression of scapula Scaption, focusing on proper movement and good control Internal and External rotation utilizing a theraband, with elbow tucked at side entire time Rows with theraband   Proper technique shown and discussed handout in great detail with ATC.  All questions were discussed and answered.     Impression and Recommendations:     The above documentation has been reviewed and is accurate and complete Benjamin Pulley, DO

## 2021-01-16 ENCOUNTER — Other Ambulatory Visit: Payer: Self-pay

## 2021-01-16 ENCOUNTER — Encounter: Payer: Self-pay | Admitting: Family Medicine

## 2021-01-16 ENCOUNTER — Ambulatory Visit: Payer: 59 | Admitting: Family Medicine

## 2021-01-16 ENCOUNTER — Ambulatory Visit (INDEPENDENT_AMBULATORY_CARE_PROVIDER_SITE_OTHER): Payer: 59

## 2021-01-16 VITALS — BP 164/130 | HR 99 | Ht 70.0 in | Wt 208.0 lb

## 2021-01-16 DIAGNOSIS — G8929 Other chronic pain: Secondary | ICD-10-CM | POA: Diagnosis not present

## 2021-01-16 DIAGNOSIS — M542 Cervicalgia: Secondary | ICD-10-CM | POA: Diagnosis not present

## 2021-01-16 MED ORDER — MELOXICAM 7.5 MG PO TABS: 7.5000 mg | ORAL_TABLET | Freq: Every day | ORAL | 0 refills | Status: DC

## 2021-01-16 MED ORDER — PREDNISONE 50 MG PO TABS: ORAL_TABLET | ORAL | 0 refills | Status: DC

## 2021-01-16 NOTE — Patient Instructions (Addendum)
Good to see you.  Ice 20 minutes 2 times daily. Usually after activity and before bed. Xray on the way out.  Prednisone daily for 5 days then  Meloxicam daily for 10 days then as needed Do not take any other anti-inflammatory  Exercises 3 times a week.  See me again in 6 weeks

## 2021-01-16 NOTE — Assessment & Plan Note (Signed)
Patient has had pain for greater than 5 years with some worsening symptoms.  X-rays ordered today and will be further evaluated later with as having difficulty with the computer system.  Prednisone given for short course and then will change from ibuprofen to meloxicam after this.  Discussed with potential icing regimen, home exercises, work with Product/process development scientist.  Worsening symptoms follow-up with me sooner and may need to consider the possibility of advanced imaging.  Patient will follow up with me again 6 weeks.  Could be a candidate for manipulation but did have difficulty with a chiropractor previously

## 2021-02-12 ENCOUNTER — Other Ambulatory Visit: Payer: Self-pay | Admitting: Family Medicine

## 2021-02-27 ENCOUNTER — Other Ambulatory Visit: Payer: Self-pay

## 2021-02-27 NOTE — Telephone Encounter (Signed)
Patient was given a short supply of methylphenidate (RITALIN) 10 MG tablet and was supposed to get the rest of the medication today but when calling the pharmacy they advised they cant refill the rest of medication until a new Rx is sent as the one on file is expired.

## 2021-02-27 NOTE — Addendum Note (Signed)
Addended by: Linton Ham on: 02/27/2021 03:49 PM   Modules accepted: Orders

## 2021-02-28 ENCOUNTER — Ambulatory Visit: Payer: 59 | Admitting: Family Medicine

## 2021-02-28 MED ORDER — METHYLPHENIDATE HCL 10 MG PO TABS: 10.0000 mg | ORAL_TABLET | Freq: Three times a day (TID) | ORAL | 0 refills | Status: DC

## 2021-03-07 ENCOUNTER — Encounter: Payer: 59 | Admitting: Family Medicine

## 2021-03-09 ENCOUNTER — Other Ambulatory Visit: Payer: Self-pay | Admitting: Family Medicine

## 2021-03-21 ENCOUNTER — Other Ambulatory Visit: Payer: Self-pay | Admitting: Family Medicine

## 2021-03-28 ENCOUNTER — Ambulatory Visit: Payer: 59 | Admitting: Family Medicine

## 2021-04-29 NOTE — Progress Notes (Signed)
[place] [address] [place] Phone: [phone] Subjective:   I Benjamin [name] am serving as a Education administrator for Dr. Hulan Saas.  This visit occurred during the SARS-CoV-2 public health emergency.  Safety protocols were in place, including screening questions prior to the visit, additional usage of staff PPE, and extensive cleaning of exam room while observing appropriate contact time as indicated for disinfecting solutions.   I'm seeing this patient by the request  of:  Marin Olp, MD  CC: Neck pain follow-up  DHR:[id number]  01/16/2021 Patient has had pain for greater than 5 years with some worsening symptoms.  X-rays ordered today and will be further evaluated later with as having difficulty with the computer system.  Prednisone given for short course and then will change from ibuprofen to meloxicam after this.  Discussed with potential icing regimen, home exercises, work with Product/process development scientist.  Worsening symptoms follow-up with me sooner and may need to consider the possibility of advanced imaging.  Patient will follow up with me again 6 weeks.  Could be a candidate for manipulation but did have difficulty with a chiropractor previously  Update 04/30/2021 Benjamin [name] is a 30 y.o. male coming in with complaint of neck pain. Patient states he is still in pain. States he has been lazy about the exercises. Medications did not help much.  Patient continues to have the pain intermittently.  Patient states that he has not been fairly noncompliant.  Xray 01/16/2021 cervical IMPRESSION: Negative cervical spine radiographs.     Past Medical History:  Diagnosis Date   Allergy    Anxiety    Asthma    only before sports   Depression    Past Surgical History:  Procedure Laterality Date   none     Social History   Socioeconomic History   Marital status: Single    Spouse name: Not on file   Number of children: Not on file    Years of education: Not on file   Highest education level: Not on file  Occupational History   Not on file  Tobacco Use   Smoking status: Never   Smokeless tobacco: Never  Substance and Sexual Activity   Alcohol use: Yes    Alcohol/week: 14.0 standard drinks    Types: 14 Standard drinks or equivalent per week   Drug use: No   Sexual activity: Not on file  Other Topics Concern   Not on file  Social History Narrative   Single. Not dating.       Full time at [employer] for business.    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, avid Scientist, clinical (histocompatibility and immunogenetics), wants to play lacrosse again   Social Determinants of Radio broadcast assistant Strain: Not on [employer] Insecurity: Not on file  Transportation Needs: Not on file  Physical Activity: Not on file  Stress: Not on file  Social Connections: Not on file   Allergies  Allergen Reactions   Penicillins    Family History  Problem Relation Age of Onset   Hypertension Mother    Hypertension Father    Alcoholism Father    Alcoholism Paternal Grandfather     Current Outpatient Medications (Endocrine & Metabolic):    predniSONE (DELTASONE) 50 MG tablet, Take one tablet daily for the next 5 days.    Current Outpatient Medications (Analgesics):  meloxicam (MOBIC) 7.5 MG tablet, TAKE 1 TABLET(7.5 MG) BY MOUTH DAILY   Current Outpatient Medications (Other):    escitalopram (LEXAPRO) 10 MG tablet, TAKE 1 TABLET(10 MG) BY MOUTH DAILY   methylphenidate (RITALIN) 10 MG tablet, Take 1 tablet (10 mg total) by mouth 3 (three) times daily with meals. May fill today   methylphenidate (RITALIN) 10 MG tablet, Take 1 tablet (10 mg total) by mouth 3 (three) times daily with meals. Fill in 2 months   methylphenidate (RITALIN) 10 MG tablet, Take 1 tablet (10 mg total) by mouth 3 (three) times daily with meals. Fill in 1 month   Reviewed prior external information including notes  and imaging from  primary care provider As well as notes that were available from care everywhere and other healthcare systems.  Past medical history, social, surgical and family history all reviewed in electronic medical record.  No pertanent information unless stated regarding to the chief complaint.   Review of Systems:  No headache, visual changes, nausea, vomiting, diarrhea, constipation, dizziness, abdominal pain, skin rash, fevers, chills, night sweats, weight loss, swollen lymph nodes, body aches, joint swelling, chest pain, shortness of breath, mood changes. POSITIVE muscle aches  Objective  Blood pressure (!) 150/90, pulse 95, height 5' 10"  (1.778 m), weight 207 lb (93.9 kg), SpO2 98 %.   General: No apparent distress alert and oriented x3 mood and affect normal, dressed appropriately.  HEENT: Pupils equal, extraocular movements intact  Respiratory: Patient's speak in full sentences and does not appear short of breath  Cardiovascular: No lower extremity edema, non tender, no erythema  Gait normal with good balance and coordination.  MSK: Neck exam does have some mild loss of lordosis.  Some tenderness to palpation of the paraspinal musculature of the neck.  Limited sidebending.  Lacks last 5 degrees of extension.  Patient does have some scapular dyskinesis noted as well.  Osteopathic findings C3 flexed rotated and side bent right C5 flexed rotated and side bent left C7 flexed rotated and side bent right T4 extended rotated and side bent right with inhaled rib   Impression and Recommendations:     The above documentation has been reviewed and is accurate and complete Lyndal Pulley, DO

## 2021-04-30 ENCOUNTER — Encounter: Payer: Self-pay | Admitting: Family Medicine

## 2021-04-30 ENCOUNTER — Other Ambulatory Visit: Payer: Self-pay

## 2021-04-30 ENCOUNTER — Ambulatory Visit: Payer: 59 | Admitting: Family Medicine

## 2021-04-30 VITALS — BP 150/90 | HR 95 | Ht 70.0 in | Wt 207.0 lb

## 2021-04-30 DIAGNOSIS — M9902 Segmental and somatic dysfunction of thoracic region: Secondary | ICD-10-CM

## 2021-04-30 DIAGNOSIS — M9901 Segmental and somatic dysfunction of cervical region: Secondary | ICD-10-CM | POA: Insufficient documentation

## 2021-04-30 DIAGNOSIS — M9908 Segmental and somatic dysfunction of rib cage: Secondary | ICD-10-CM | POA: Diagnosis not present

## 2021-04-30 DIAGNOSIS — M542 Cervicalgia: Secondary | ICD-10-CM

## 2021-04-30 NOTE — Assessment & Plan Note (Signed)
Chronic neck pain.  Patient's x-rays were unremarkable.  Patient responded very well to osteopathic manipulation.  Discussed posture and ergonomics.  Discussed which activities to do.  Will refer to formal physical therapy.  Follow-up with me again in 6 to 8 weeks

## 2021-04-30 NOTE — Assessment & Plan Note (Signed)
   Decision today to treat with OMT was based on Physical Exam  After verbal consent patient was treated with HVLA, ME, FPR techniques in cervical, thoracic, rib,  areas, all areas are chronic   Patient tolerated the procedure well with improvement in symptoms  Patient given exercises, stretches and lifestyle modifications  See medications in patient instructions if given  Patient will follow up in 6 weeks

## 2021-04-30 NOTE — Patient Instructions (Signed)
Good to see you [facility] PT will call you Tried manipulations with good resolution of pain Try doing exercises on your own See me again in 6 weeks

## 2021-05-21 ENCOUNTER — Encounter: Payer: Self-pay | Admitting: Family Medicine

## 2021-06-11 ENCOUNTER — Ambulatory Visit: Payer: 59 | Admitting: Family Medicine

## 2021-06-11 NOTE — Progress Notes (Deleted)
  [place] Phone: [phone] Subjective:    I'm seeing this patient by the request  of:  Marin Olp, MD  CC:   [id number]:[id number]  Benjamin [name] is a 30 y.o. male coming in with complaint of back and neck pain. OMT on 04/30/2021.  Patient states   Medications patient has been prescribed: Meloxicam  Taking:         Reviewed prior external information including notes and imaging from previsou exam, outside providers and external EMR if available.   As well as notes that were available from care everywhere and other healthcare systems.  Past medical history, social, surgical and family history all reviewed in electronic medical record.  No pertanent information unless stated regarding to the chief complaint.   Past Medical History:  Diagnosis Date   Allergy    Anxiety    Asthma    only before sports   Depression     Allergies  Allergen Reactions   Penicillins      Review of Systems:  No headache, visual changes, nausea, vomiting, diarrhea, constipation, dizziness, abdominal pain, skin rash, fevers, chills, night sweats, weight loss, swollen lymph nodes, body aches, joint swelling, chest pain, shortness of breath, mood changes. POSITIVE muscle aches  Objective  There were no vitals taken for this visit.   General: No apparent distress alert and oriented x3 mood and affect normal, dressed appropriately.  HEENT: Pupils equal, extraocular movements intact  Respiratory: Patient's speak in full sentences and does not appear short of breath  Cardiovascular: No lower extremity edema, non tender, no erythema  Neuro: Cranial nerves II through XII are intact, neurovascularly intact in all extremities with 2+ DTRs and 2+ pulses.  Gait normal with good balance and coordination.  MSK:  Non tender with full range of motion and good stability and symmetric strength and tone of shoulders, elbows,  wrist, hip, knee and ankles bilaterally.  Back - Normal skin, Spine with normal alignment and no deformity.  No tenderness to vertebral process palpation.  Paraspinous muscles are not tender and without spasm.   Range of motion is full at neck and lumbar sacral regions  Osteopathic findings  C2 flexed rotated and side bent right C6 flexed rotated and side bent left T3 extended rotated and side bent right inhaled rib T9 extended rotated and side bent left L2 flexed rotated and side bent right Sacrum right on right       Assessment and Plan:    Nonallopathic problems  Decision today to treat with OMT was based on Physical Exam  After verbal consent patient was treated with HVLA, ME, FPR techniques in cervical, rib, thoracic, lumbar, and sacral  areas  Patient tolerated the procedure well with improvement in symptoms  Patient given exercises, stretches and lifestyle modifications  See medications in patient instructions if given  Patient will follow up in 4-8 weeks      The above documentation has been reviewed and is accurate and complete Lyndal Pulley, DO       Note: This dictation was prepared with Dragon dictation along with smaller phrase technology. Any transcriptional errors that result from this process are unintentional.

## 2021-06-25 ENCOUNTER — Other Ambulatory Visit: Payer: Self-pay

## 2021-06-25 ENCOUNTER — Encounter (HOSPITAL_BASED_OUTPATIENT_CLINIC_OR_DEPARTMENT_OTHER): Payer: Self-pay | Admitting: Emergency Medicine

## 2021-06-25 ENCOUNTER — Emergency Department (HOSPITAL_BASED_OUTPATIENT_CLINIC_OR_DEPARTMENT_OTHER)
Admission: EM | Admit: 2021-06-25 | Discharge: 2021-06-25 | Disposition: A | Discharge status: Home / Self Care | Payer: 59 | Attending: Emergency Medicine | Admitting: Emergency Medicine

## 2021-06-25 DIAGNOSIS — S61122A Laceration with foreign body of left thumb with damage to nail, initial encounter: Secondary | ICD-10-CM | POA: Insufficient documentation

## 2021-06-25 DIAGNOSIS — J45909 Unspecified asthma, uncomplicated: Secondary | ICD-10-CM | POA: Diagnosis not present

## 2021-06-25 DIAGNOSIS — X58XXXA Exposure to other specified factors, initial encounter: Secondary | ICD-10-CM | POA: Insufficient documentation

## 2021-06-25 DIAGNOSIS — S61122S Laceration with foreign body of left thumb with damage to nail, sequela: Secondary | ICD-10-CM

## 2021-06-25 DIAGNOSIS — S6992XA Unspecified injury of left wrist, hand and finger(s), initial encounter: Secondary | ICD-10-CM | POA: Diagnosis present

## 2021-06-25 DIAGNOSIS — Z23 Encounter for immunization: Secondary | ICD-10-CM | POA: Diagnosis not present

## 2021-06-25 DIAGNOSIS — Y9389 Activity, other specified: Secondary | ICD-10-CM | POA: Insufficient documentation

## 2021-06-25 MED ORDER — LIDOCAINE HCL 2 % IJ SOLN: 20.0000 mL | Freq: Once | INTRAMUSCULAR | Status: DC

## 2021-06-25 MED ORDER — LIDOCAINE HCL (PF) 1 % IJ SOLN
INTRAMUSCULAR | Status: AC
  Filled 2021-06-25: qty 5

## 2021-06-25 MED ORDER — TETANUS-DIPHTH-ACELL PERTUSSIS 5-2.5-18.5 LF-MCG/0.5 IM SUSY
0.5000 mL | PREFILLED_SYRINGE | Freq: Once | INTRAMUSCULAR | Status: AC
  Administered 2021-06-25: 0.5 mL via INTRAMUSCULAR
  Filled 2021-06-25: qty 0.5

## 2021-06-25 NOTE — Discharge Instructions (Addendum)
You were seen today and had a foreign body in your nailbed.  This was removed.  Part of your nail will likely fall off because of the procedure.  Use splint to protect.

## 2021-06-25 NOTE — ED Triage Notes (Signed)
Pt presents to ED POV. Pt c/o finger injury to L thumb. Pt reports that he was scraping super glue off table and it went under nail.

## 2021-06-25 NOTE — ED Notes (Signed)
Discharge instructions discussed with pt. Pt verbalized understanding. Pt stable and ambulatory.  °

## 2021-06-25 NOTE — ED Provider Notes (Signed)
[facility] EMERGENCY DEPT Provider Note   CSN: [id number] Arrival date & time: 06/25/21  0442     History Chief Complaint  Patient presents with   Finger Injury    Benjamin [name] is a 30 y.o. male.  HPI     This a 30 year old male with history of asthma who presents with left nail pain and injury.  Patient reports that he was scraping some fluid off of a table when he believes a foreign body went into his left thumb.  He reports 10 out of 10 pain.  He states that he believes it is down to the base of his thumb.  He tried to have it removed without success.  Denies other injury.  Unknown last tetanus shot.  Patient has not taken anything for pain.  He is right-handed.  Past Medical History:  Diagnosis Date   Allergy    Anxiety    Asthma    only before sports   Depression     Patient Active Problem List   Diagnosis Date Noted   Somatic dysfunction of spine, cervical 04/30/2021   Chronic neck pain 01/16/2021   Ulcerative colitis ([place]) 04/07/2019   White coat syndrome without diagnosis of hypertension 07/31/2018   Factor V Leiden mutation ([place]) 11/28/2016   Asthma    Generalized anxiety disorder 02/20/2013   Attention deficit disorder (ADD) in adult 05/25/2012    Past Surgical History:  Procedure Laterality Date   none         Family History  Problem Relation Age of Onset   Hypertension Mother    Hypertension Father    Alcoholism Father    Alcoholism Paternal Grandfather     Social History   Tobacco Use   Smoking status: Never   Smokeless tobacco: Never  Substance Use Topics   Alcohol use: Yes    Alcohol/week: 14.0 standard drinks    Types: 14 Standard drinks or equivalent per week   Drug use: No    Home Medications Prior to Admission medications   Medication Sig Start Date End Date Taking? Authorizing Provider  escitalopram (LEXAPRO) 10 MG tablet TAKE 1 TABLET(10 MG) BY MOUTH DAILY 03/11/21   Marin Olp, MD  meloxicam  (MOBIC) 7.5 MG tablet TAKE 1 TABLET(7.5 MG) BY MOUTH DAILY 03/21/21   Lyndal Pulley, DO  methylphenidate (RITALIN) 10 MG tablet Take 1 tablet (10 mg total) by mouth 3 (three) times daily with meals. May fill today 02/28/21 03/30/21  Marin Olp, MD  methylphenidate (RITALIN) 10 MG tablet Take 1 tablet (10 mg total) by mouth 3 (three) times daily with meals. Fill in 2 months 02/28/21 03/30/21  Marin Olp, MD  methylphenidate (RITALIN) 10 MG tablet Take 1 tablet (10 mg total) by mouth 3 (three) times daily with meals. Fill in 1 month 02/28/21 03/30/21  Marin Olp, MD  predniSONE (DELTASONE) 50 MG tablet Take one tablet daily for the next 5 days. 01/16/21   Lyndal Pulley, DO    Allergies    Penicillins  Review of Systems   Review of Systems  Constitutional:  Negative for fever.  Skin:  Negative for wound.       Bruising and blood noted under the 1st left nail  All other systems reviewed and are negative.  Physical Exam Updated Vital Signs BP (!) 194/148 (BP Location: Right Arm)   Pulse (!) 112   Temp 97.9 F (36.6 C) (Oral)   Resp 12   Ht  1.778 m (5' 10" )   Wt 95.3 kg   SpO2 100%   BMI 30.13 kg/m   Physical Exam Vitals and nursing note reviewed.  Constitutional:      Appearance: He is well-developed. He is not ill-appearing.  HENT:     Head: Normocephalic and atraumatic.     Nose: Nose normal.     Mouth/Throat:     Mouth: Mucous membranes are moist.  Cardiovascular:     Rate and Rhythm: Normal rate and regular rhythm.  Pulmonary:     Effort: Pulmonary effort is normal. No respiratory distress.  Abdominal:     Palpations: Abdomen is soft.  Musculoskeletal:        General: No deformity.     Cervical back: Neck supple.  Lymphadenopathy:     Cervical: No cervical adenopathy.  Skin:    General: Skin is warm and dry.     Comments: Blood noted underneath the left first nail, no palpable foreign body, exquisite tenderness to palpation  Neurological:      Mental Status: He is alert and oriented to person, place, and time.  Psychiatric:        Mood and Affect: Mood normal.    ED Results / Procedures / Treatments   Labs (all labs ordered are listed, but only abnormal results are displayed) Labs Reviewed - No data to display  EKG None  Radiology No results found.  Procedures .Nail Removal  Date/Time: 06/25/2021 6:05 AM Performed by: Merryl Hacker, MD Authorized by: Merryl Hacker, MD   Consent:    Consent obtained:  Verbal   Risks, benefits, and alternatives were discussed: yes     Risks discussed:  Bleeding, incomplete removal, pain and infection   Alternatives discussed:  No treatment Location:    Hand:  L thumb Pre-procedure details:    Skin preparation:  Povidone-iodine   Preparation: Patient was prepped and draped in the usual sterile fashion   Anesthesia:    Anesthesia method:  Nerve block   Block location:  Ring   Block needle gauge:  27 G   Block anesthetic:  Lidocaine 1% w/o epi   Block injection procedure:  Anatomic landmarks identified, introduced needle, negative aspiration for blood and anatomic landmarks palpated   Block outcome:  Anesthesia achieved Nail Removal:    Nail removed:  Partial   Nail side:  Radial   Nail bed repaired: no   Ingrown nail:    Nail matrix removed or ablated:  None Post-procedure details:    Dressing:  Antibiotic ointment and non-adhesive packing strip   Procedure completion:  Tolerated Comments:     Small 0.5 x 0.2 cm piece of glass removed from the base of the nailbed, nail replaced for protection, splint placed for protection   Medications Ordered in ED Medications  lidocaine (XYLOCAINE) 2 % (with pres) injection 400 mg (400 mg Infiltration Not Given 06/25/21 0511)  Tdap (BOOSTRIX) injection 0.5 mL (has no administration in time range)  lidocaine (PF) (XYLOCAINE) 1 % injection (  Given 06/25/21 0517)    ED Course  I have reviewed the triage vital signs and the  nursing notes.  Pertinent labs & imaging results that were available during my care of the patient were reviewed by me and considered in my medical decision making (see chart for details).    MDM Rules/Calculators/A&P  Patient presents with concern for injury to the left first digit nailbed.  He is nontoxic and vital signs are notable for hypertension and tachycardia.  May be pain related.  He has exquisite tenderness with palpation of the nail bed.  I am unable to visualize any foreign body.  We discussed nail trephination for exploration.  Patient consented.  Nail was partially trephinated laterally.  I was able to remove a small foreign body which was likely the cause of the patient's pain.  Nail remained intact at the cuticle and I replaced for protection.  Antibiotic ointment applied and patient was given a splint for protection.  Discussed with him that part of his nail would likely fall off because of the trepanation.  He stated understanding tetanus was updated.  After history, exam, and medical workup I feel the patient has been appropriately medically screened and is safe for discharge home. Pertinent diagnoses were discussed with the patient. Patient was given return precautions.  Final Clinical Impression(s) / ED Diagnoses Final diagnoses:  Laceration with foreign body of left thumb with damage to nail, sequela    Rx / DC Orders ED Discharge Orders     None        Merryl Hacker, MD 06/25/21 [phone]

## 2021-07-23 ENCOUNTER — Other Ambulatory Visit: Payer: Self-pay

## 2021-07-23 MED ORDER — METHYLPHENIDATE HCL 10 MG PO TABS: 10.0000 mg | ORAL_TABLET | Freq: Three times a day (TID) | ORAL | 0 refills | Status: DC

## 2021-07-23 NOTE — Telephone Encounter (Signed)
  Encourage patient to contact the pharmacy for refills or they can request refills through [place]:  09/06/2020  NEXT APPOINTMENT DATE: 10/31  MEDICATION: methylphenidate (RITALIN) 10 MG tablet (Expired)  Is the patient out of medication? YES   PHARMACY: [id number] - [place], [place]  Let patient know to contact pharmacy at the end of the day to make sure medication is ready.  Please notify patient to allow 48-72 hours to process

## 2021-07-29 NOTE — Progress Notes (Signed)
Phone: [phone]    Subjective:  Patient presents today for their annual physical. Chief complaint-noted.   See problem oriented charting- ROS- full  review of systems was completed and negative  except for: anxiety, some down mood- hard time with GF over summer- doing better now  The following were reviewed and entered/updated in epic: Past Medical History:  Diagnosis Date   Allergy    Anxiety    Asthma    only before sports   Depression    Patient Active Problem List   Diagnosis Date Noted   Ulcerative colitis ([place]) 04/07/2019    Priority: High   Factor V Leiden mutation ([place]) 11/28/2016    Priority: High   Attention deficit disorder (ADD) in adult 05/25/2012    Priority: High   White coat syndrome without diagnosis of hypertension 07/31/2018    Priority: Medium    Asthma     Priority: Medium    Generalized anxiety disorder 02/20/2013    Priority: Medium    Somatic dysfunction of spine, cervical 04/30/2021   Chronic neck pain 01/16/2021   Past Surgical History:  Procedure Laterality Date   none      Family History  Problem Relation Age of Onset   Hypertension Mother        Has CAD   Hypertension Father    Alcoholism Father    Alcoholism Paternal Grandfather     Medications- reviewed and updated Current Outpatient Medications  Medication Sig Dispense Refill   busPIRone (BUSPAR) 10 MG tablet Take 10 mg by mouth daily.     escitalopram (LEXAPRO) 10 MG tablet TAKE 1 TABLET(10 MG) BY MOUTH DAILY 90 tablet 2   hydrOXYzine (ATARAX/VISTARIL) 25 MG tablet SMARTSIG:0.5 Tablet(s) By Mouth Every Evening     methylphenidate (RITALIN) 10 MG tablet Take 1 tablet (10 mg total) by mouth 3 (three) times daily with meals. May fill today 90 tablet 0   methylphenidate (RITALIN) 10 MG tablet Take 1 tablet (10 mg total) by mouth 3 (three) times daily with meals. Fill in 1 month 90 tablet 0   methylphenidate (RITALIN) 10 MG tablet Take 1 tablet (10 mg total) by mouth 3  (three) times daily with meals. Fill in 2 months 90 tablet 0   No current facility-administered medications for this visit.    Allergies-reviewed and updated Allergies  Allergen Reactions   Penicillins     Social History   Social History Narrative   ** Merged History Encounter **       Single. Not dating.   Full time at [employer] for business.  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 Oropharynx normal Neck: no thyromegaly CV: RRR no murmurs rubs or gallops Lungs: CTAB no crackles, wheeze, rhonchi Abdomen: soft/nontender/nondistended/normal bowel sounds. No rebound or guarding.  Ext: no edema Skin: warm, dry Neuro: grossly normal, moves all extremities, PERRLA     Assessment and Plan:  30 y.o. male presenting for annual physical.  Health Maintenance counseling: 1. Anticipatory guidance: Patient counseled regarding regular dental exams- q6 months, eye exams -  yearly with contacts,  avoiding smoking and second hand smoke , limiting alcohol to 2 beverages per day- plus needs to space out from ritalin- up to 3 a day- discussed affect on liver potentially plus blood pressure, no illicit drugs.   2. Risk factor reduction:  Advised patient of need for regular exercise and diet rich and fruits and vegetables to reduce risk of heart attack and stroke. Exercise- starting back into this. Diet-wants to work on losing weight.  Wt Readings from Last 3 Encounters:  08/05/21 209 lb 3.2 oz (94.9 kg)  06/25/21 210 lb (95.3 kg)  04/30/21 207 lb (93.9 kg)  3. Immunizations/screenings/ancillary studies DISCUSSED:  -Prevnar-20 vaccination - recommended due to history of  asthma-less reasonable with ulcerative colitis history- declines for now -Hepatitis C Screening - discussed doing this with blood work- will do next time -covid booster - listed as having bivalent booster but I think this is less likely due to dates-discussed this with patient-could consider repeat but has had covid and overall low risk -flu vaccination (last one 12/21) - today recommended Immunization History  Administered Date(s) Administered   Hepatitis B, adult 03/08/2018   Influenza,inj,Quad PF,6+ Mos 08/06/2015, 11/24/2016, 07/20/2017, 07/30/2018, 08/04/2019, 09/06/2020   PFIZER(Purple Top)SARS-COV-2 Vaccination 12/24/2019, 01/14/2020   Pfizer Covid-19 Vaccine Bivalent Booster 68yr & up 04/02/2021   Tdap 08/06/2015, 06/25/2021   Typhoid Live 03/18/2018  4. Prostate cancer screening- no family history, start at age 29   5 Colon cancer screening - colonoscopy 03/25/19- early due to UC- will likely be monitored more regularly than average 29year old 6. Skin cancer screening/prevention- no dermatologist- family history skin cancer.  advised regular sunscreen use. Denies worrisome, changing, or new skin lesions.  7. Testicular cancer screening- advised monthly self exams  8. STD screening- patient opts  out- long term GF 4 years 9. Smoking associated screening- Never smoker  Status of chronic or acute concerns   #Social update[place]. 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 He reported he was scraping some fluid off a table and he believed a foreign body went into his left thumb. He reported the pain 10/10, He stated that he believed it was down to the base of thumb. He tried to have it removed without success however denied other injury.  - Nail remained intact at the cuticle and was  replaced for protection. Antibiotic ointment applied and patient was given a splint for protection. Discussed with him that part of his nail would likely fall off because of the trepanation.   A/P: healing well- no lingering pain. Up to date on tetanus. ER bill 1300 dollars   #White Coat Syndrome without hypertension S: In 2020 discovered patient was taking intermittent decongestants and regular Afrin-we recommended discontinuation- he was back to 4-5 times a day unfortunately.  He declined ENT when initially discovered.  Discussed again importance of avoiding Afrin and effects on blood pressure as well as potentially ritalin  Home readings #s: running higher with anxiety BP Readings from Last 3 Encounters:  08/05/21 (!) 156/98  06/25/21 (!) 194/148 in ER with severe pain  04/30/21 (!) 150/90  A/P: Blood pressure is elevated today-patient thinks anxiety playing a large role with mother's  need for bypass this year.  I was honest with him that I am  very concerned about continuing Ritalin-if he is going to continue this we need to make sure home blood pressures look better and we must eliminate the Afrin 100%-asked him to update me in 1 week.  We also discussed potentially stopping Ritalin even if off of Afrin but he would like to discuss this with cardiologist (seeing his mom's cardiologist) before making this decision   #Adult ADD S: Compliant with Ritalin 10 mg three times daily but may skip evening dose if needed or if had less schoolwork can take half- still slept ok. Depression with extended release version in the past.  -Diagnosed 2011 by Camillia Herter, MS, [facility] -NCCSRS reviewed today - no high risk use -UDS 09/06/2020 - Controlled substance contract March 26, 2018 A/P:  Reasonable control on 3 times a day Ritalin-doing well with this.  Can refill again in 3 months by MyChart or phone call and will need a 13-monthvisit in person  - very difficult to focus if misses  doses- takes pretty consistently  #Generalized anxiety disorder S: Patient remained on escitalopram 10 mg daily.  Was on Zoloft in the past for depression and GAD-diagnosed in high school-admitted several other failures. Felt mentally slowed on 20 mg Lexapro but has done reasonably well on this dose A/P: seeing [facility] through his insurance- works with both NP and therapist through them. On both lexapro 10 mg and buspirone but with school/work/moving/moms history this year- a lot of stress still on his plate.    #Factor V Leiden mutation-heterozygous S: Patient does not want lifelong anticoagulation unless DVT or PE.  Dr. GBeryle Beamsactually wrote the prescription for the original test but has not seen patient in the office-he was sent a copy of the report though. A/P: No evidence of DVT or PE today.  Patient wants to continue to monitor off of medication   #Ulcerative Colitis-[facility] to [facility] as well as elevated ESR and CRP.  Formally diagnosed on colonoscopy March 25, 2019. He had been fine without medicine recently -Mother on lifelong anticoagulation A/P: no recent issues- will monitor   #hyperlipidemia S: Medication:none  Lab Results  Component Value Date   CHOL 224 (H) 04/28/2019   HDL 64.60 04/28/2019   LDLCALC 138 (H) 04/28/2019   TRIG 105.0 04/28/2019   CHOLHDL 3 04/28/2019   A/P: for now ulness recommended by cardiology hold off on statin unless LDL over 190  #family history of CAD in mom- will be seeing cardiology- will see Dr. NAcie Fredrickson Recommended follow up: Return in about 6 months (around 02/02/2022) for follow-up or sooner if needed.  Lab/Order associations:NOT  fasting   ICD-10-CM   1. Preventative health care  Z00.00 CBC with Differential/Platelet    Comprehensive metabolic panel    Lipid panel    2. White coat syndrome without diagnosis of hypertension  R03.0     3.  Attention deficit disorder (ADD) in adult  F98.8     4. Generalized anxiety disorder  F41.1     5. Factor V Leiden mutation ([place]  D68.51     6. Crohn's disease with complication, unspecified gastrointestinal tract location ([place]  K50.919     7. Need for immunization against influenza  Z23     8. Hyperlipidemia, unspecified hyperlipidemia type  E78.5 CBC with Differential/Platelet    Comprehensive metabolic panel    Lipid panel    9. Ulcerative colitis without  complications, unspecified location ([place])  K51.90       No orders of the defined types were placed in this encounter.  I,Jada Bradford,acting as a scribe for Garret Reddish, MD.,have documented all relevant documentation on the behalf of Garret Reddish, MD,as directed by  Garret Reddish, MD while in the presence of Garret Reddish, MD.  I, Garret Reddish, MD, have reviewed all documentation for this visit. The documentation on 08/05/21 for the exam, diagnosis, procedures, and orders are all accurate and complete.  Return precautions advised.   Garret Reddish, MD

## 2021-08-05 ENCOUNTER — Encounter: Payer: Self-pay | Admitting: Family Medicine

## 2021-08-05 ENCOUNTER — Other Ambulatory Visit: Payer: Self-pay

## 2021-08-05 ENCOUNTER — Ambulatory Visit (INDEPENDENT_AMBULATORY_CARE_PROVIDER_SITE_OTHER): Payer: 59 | Admitting: Family Medicine

## 2021-08-05 VITALS — BP 156/98 | HR 94 | Temp 98.0°F | Ht 70.0 in | Wt 209.2 lb

## 2021-08-05 DIAGNOSIS — Z23 Encounter for immunization: Secondary | ICD-10-CM

## 2021-08-05 DIAGNOSIS — F988 Other specified behavioral and emotional disorders with onset usually occurring in childhood and adolescence: Secondary | ICD-10-CM

## 2021-08-05 DIAGNOSIS — R030 Elevated blood-pressure reading, without diagnosis of hypertension: Secondary | ICD-10-CM

## 2021-08-05 DIAGNOSIS — Z0000 Encounter for general adult medical examination without abnormal findings: Secondary | ICD-10-CM

## 2021-08-05 DIAGNOSIS — F411 Generalized anxiety disorder: Secondary | ICD-10-CM

## 2021-08-05 DIAGNOSIS — D6851 Activated protein C resistance: Secondary | ICD-10-CM

## 2021-08-05 DIAGNOSIS — K50919 Crohn's disease, unspecified, with unspecified complications: Secondary | ICD-10-CM

## 2021-08-05 DIAGNOSIS — E785 Hyperlipidemia, unspecified: Secondary | ICD-10-CM

## 2021-08-05 DIAGNOSIS — K5190 Ulcerative colitis, unspecified, without complications: Secondary | ICD-10-CM

## 2021-08-05 NOTE — Patient Instructions (Addendum)
Health Maintenance Due  Topic Date Due   Pneumococcal Vaccine 16-29 Years old (1 - PCV)-   -Hold off at this time.  Never done   INFLUENZA VACCINE -   - Regular dose flu shot today.  05/06/2021   Thank you for doing your labs today! If you have mychart- we will send your results within 3 business days of [place] receiving them.  If you do not have mychart- we will call you about results within 5 business days of [place] receiving them.  *please also note that you will see labs on mychart as soon as they post. I will later go in and write notes on them- will say "notes from Dr. Yong Channel"  Please discontinue Afrin and update me in a week with blood pressure readings. Blood pressure should average <135/85 at home. If bp not coming down may need to consider holding ritalin or reducing dose. Plus limiting or eliminating alcohol will help  I love that you're starting back exercising! Keep up the great work! Recommend 150 minutes per week (30 minutes a day).  Also, please consider trying the half-plate method for your caloric intake!  Or work on portion size. Lets work on 10 lbs down in next year  Recommended follow up: Return in about 6 months (around 02/02/2022) for follow-up or sooner if needed.

## 2021-08-06 LAB — LIPID PANEL
Cholesterol: 254 mg/dL — ABNORMAL HIGH (ref 0–200)
HDL: 52.8 mg/dL (ref >39.00)
LDL Cholesterol: 172 mg/dL — ABNORMAL HIGH (ref 0–99)
NonHDL: 201.28
Total CHOL/HDL Ratio: 5
Triglycerides: 148 mg/dL (ref 0.0–149.0)
VLDL: 29.6 mg/dL (ref 0.0–40.0)

## 2021-08-06 LAB — CBC WITH DIFFERENTIAL/PLATELET
Basophils Absolute: 0.1 10*3/uL (ref 0.0–0.1)
Basophils Relative: 0.9 % (ref 0.0–3.0)
Eosinophils Absolute: 0.1 10*3/uL (ref 0.0–0.7)
Eosinophils Relative: 1.7 % (ref 0.0–5.0)
HCT: 39.9 % (ref 39.0–52.0)
Hemoglobin: 13.8 g/dL (ref 13.0–17.0)
Lymphocytes Relative: 32.4 % (ref 12.0–46.0)
Lymphs Abs: 2.2 10*3/uL (ref 0.7–4.0)
MCHC: 34.6 g/dL (ref 30.0–36.0)
MCV: 91 fl (ref 78.0–100.0)
Monocytes Absolute: 0.7 10*3/uL (ref 0.1–1.0)
Monocytes Relative: 9.9 % (ref 3.0–12.0)
Neutro Abs: 3.8 10*3/uL (ref 1.4–7.7)
Neutrophils Relative %: 55.1 % (ref 43.0–77.0)
Platelets: 353 10*3/uL (ref 150.0–400.0)
RBC: 4.39 Mil/uL (ref 4.22–5.81)
RDW: 12.6 % (ref 11.5–15.5)
WBC: 6.8 10*3/uL (ref 4.0–10.5)

## 2021-08-06 LAB — COMPREHENSIVE METABOLIC PANEL
ALT: 38 U/L (ref 0–53)
AST: 28 U/L (ref 0–37)
Albumin: 4.7 g/dL (ref 3.5–5.2)
Alkaline Phosphatase: 79 U/L (ref 39–117)
BUN: 19 mg/dL (ref 6–23)
CO2: 26 mEq/L (ref 19–32)
Calcium: 9.8 mg/dL (ref 8.4–10.5)
Chloride: 102 mEq/L (ref 96–112)
Creatinine, Ser: 0.97 mg/dL (ref 0.40–1.50)
GFR: 105.56 mL/min (ref >60.00)
Glucose, Bld: 87 mg/dL (ref 70–99)
Potassium: 3.6 mEq/L (ref 3.5–5.1)
Sodium: 138 mEq/L (ref 135–145)
Total Bilirubin: 0.4 mg/dL (ref 0.2–1.2)
Total Protein: 7.6 g/dL (ref 6.0–8.3)

## 2021-10-05 IMAGING — DX DG CERVICAL SPINE 2 OR 3 VIEWS
3 series · 3 of 3 positions shown · non-contrast
Comparison: None.

CLINICAL DATA: Posterior neck pain

EXAM:
CERVICAL SPINE - 2-3 VIEW

[c-spine lat]
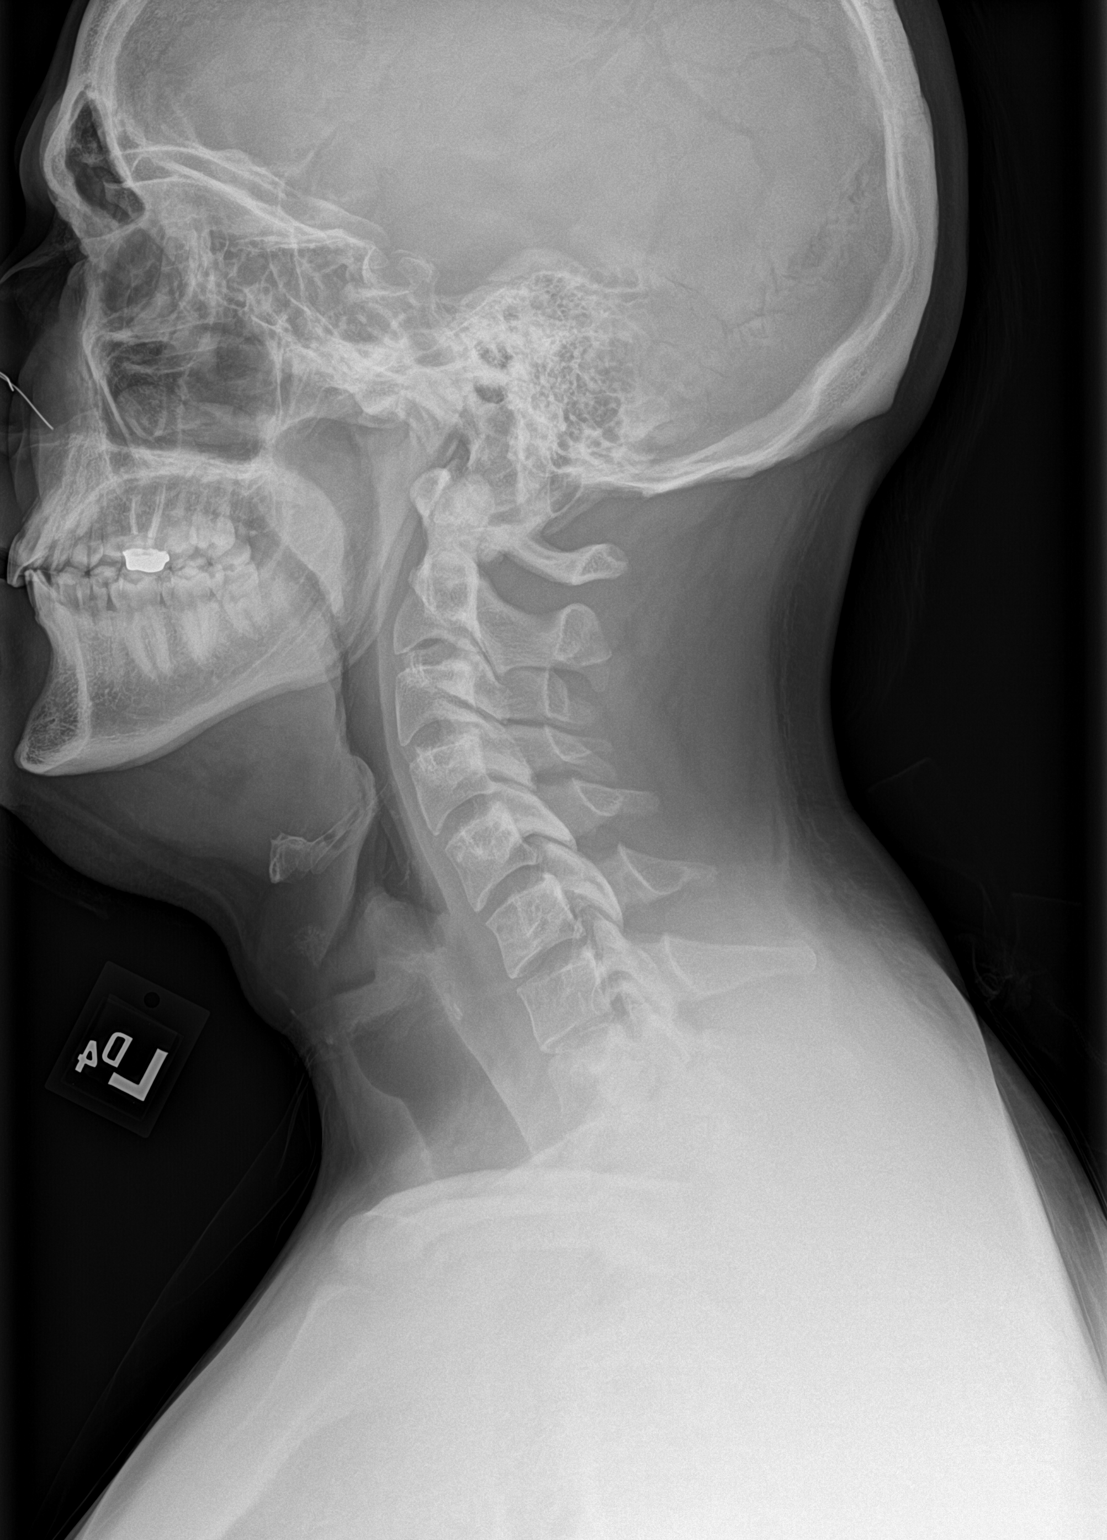

[c-spine ap]
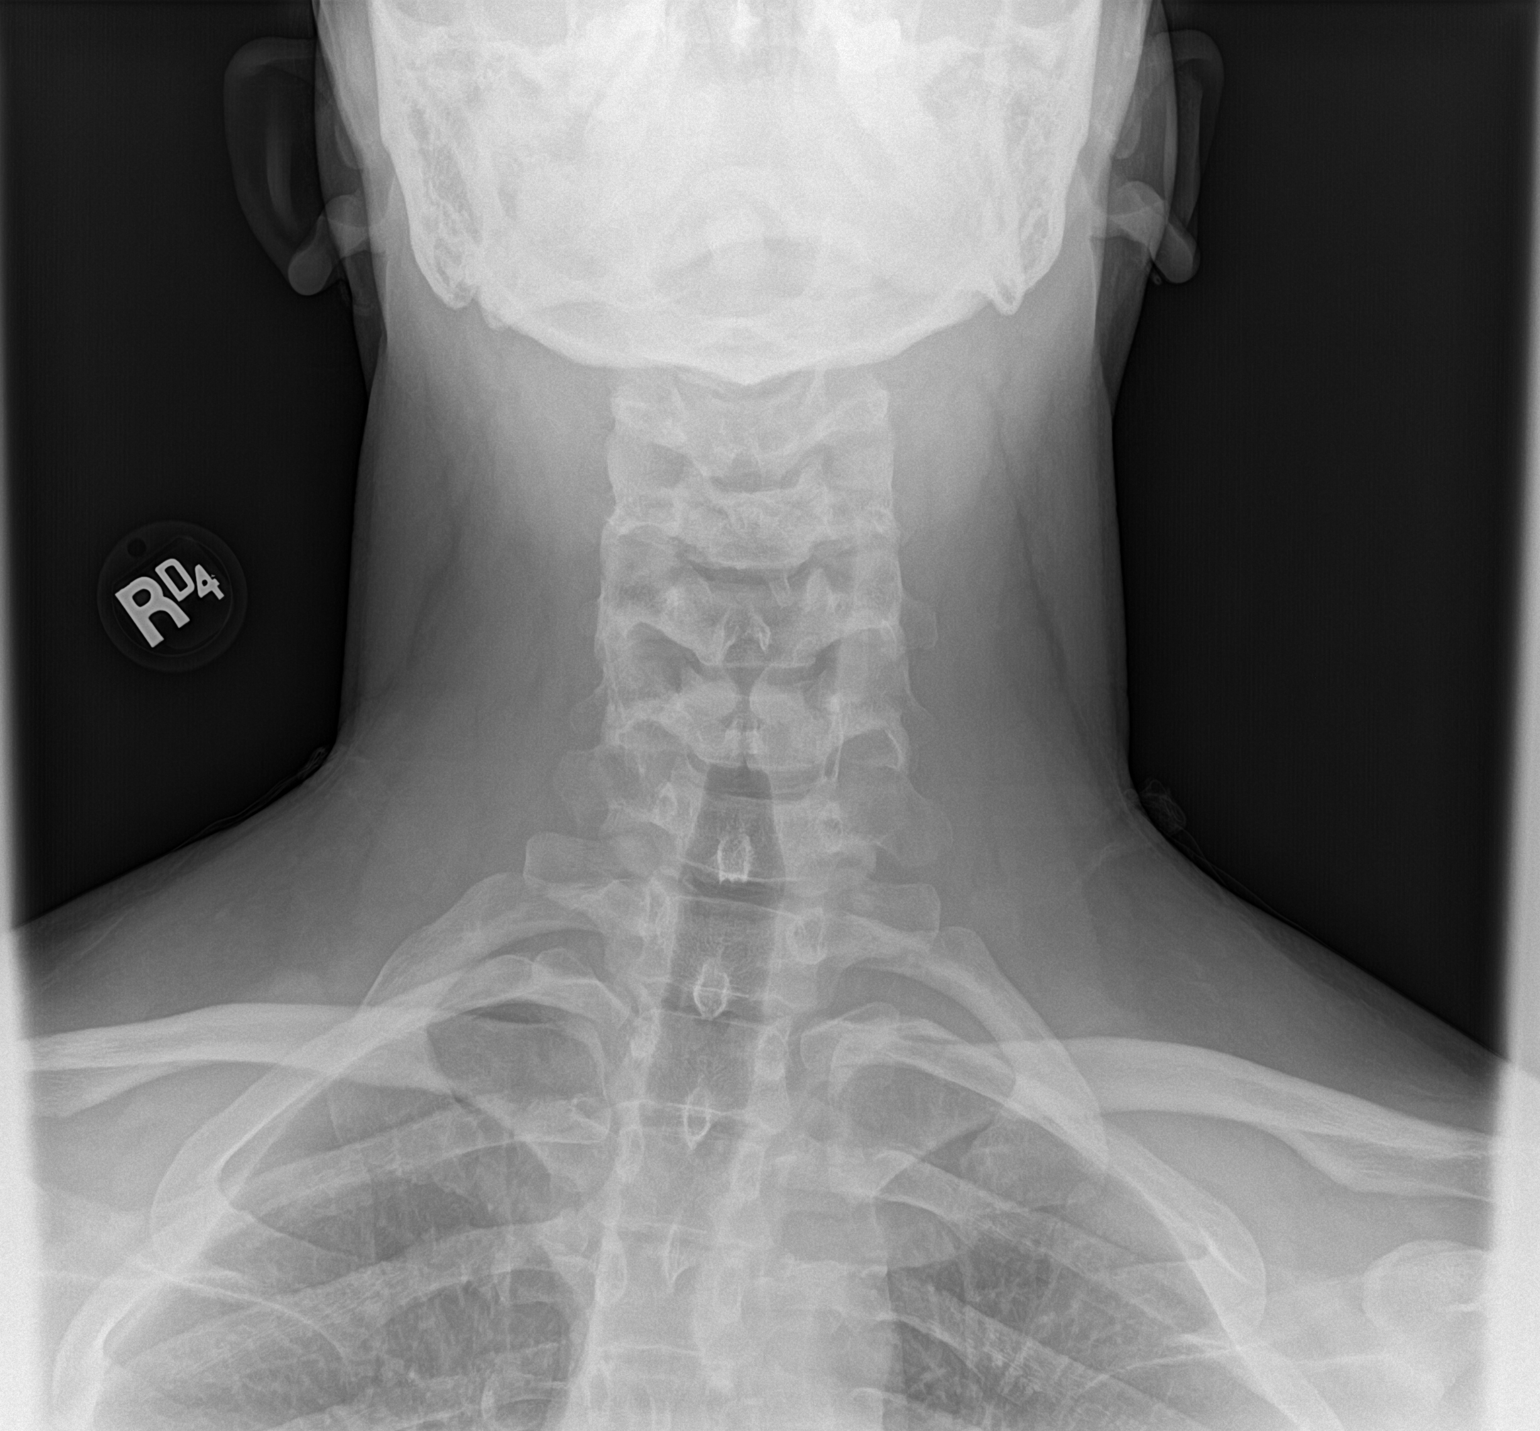

[c-spine open mouth]
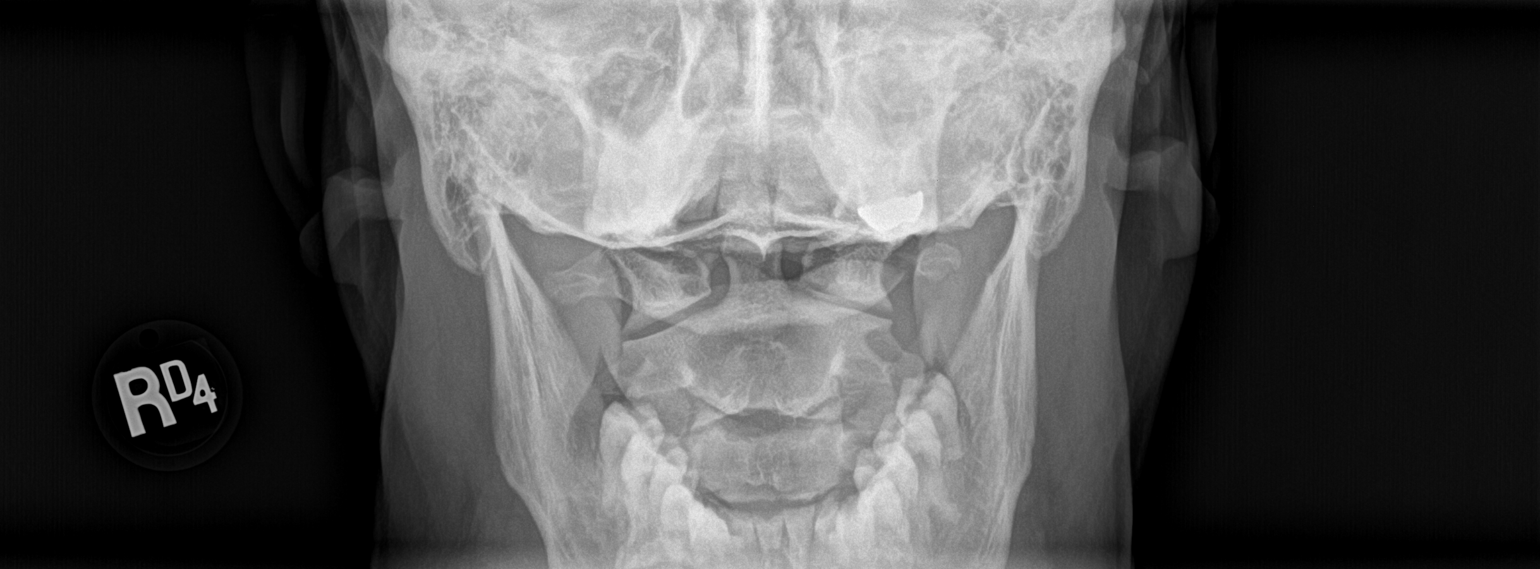

[3 of 3 positions shown; findings below may reference images not displayed]

FINDINGS: Alignment within normal limits. Vertebral body heights are normal.
The disc spaces are patent. Normal prevertebral soft tissue
thickness. The dens and lateral masses are unremarkable.
IMPRESSION: Negative cervical spine radiographs.

## 2021-10-30 ENCOUNTER — Other Ambulatory Visit: Payer: Self-pay | Admitting: Family Medicine

## 2021-10-31 NOTE — Telephone Encounter (Signed)
Team I need you to clarify normal- what are his home BP readings lately?

## 2021-11-01 ENCOUNTER — Telehealth: Payer: Self-pay | Admitting: Family Medicine

## 2021-11-01 ENCOUNTER — Encounter: Payer: Self-pay | Admitting: Family Medicine

## 2021-11-01 MED ORDER — METHYLPHENIDATE HCL 10 MG PO TABS: 10.0000 mg | ORAL_TABLET | Freq: Three times a day (TID) | ORAL | 0 refills | Status: DC

## 2021-11-01 NOTE — Telephone Encounter (Signed)
Pt is completely out of Ritalin. Pt needs a refill asap today.

## 2021-11-01 NOTE — Telephone Encounter (Signed)
I will respond in a MyChart message to patient

## 2021-11-01 NOTE — Telephone Encounter (Signed)
October 31, 2021 Me to [facility]      8:53 PM Note Team I need you to clarify normal- what are his home BP readings lately?       I never got info back on this- can you tell me what his home readings are- I wanted to see if we can safely prescribe medication

## 2021-11-01 NOTE — Telephone Encounter (Signed)
Spoke to patient and he has checked has been 120/90 range last checked 2 days ago

## 2021-11-01 NOTE — Telephone Encounter (Signed)
Patient sent mychart message as well, front advised him that will respond by end of day as we are with patients in office

## 2021-11-05 NOTE — Telephone Encounter (Signed)
LM for pt tcb. 

## 2021-11-06 NOTE — Telephone Encounter (Signed)
Pt states he was in conversation with you via mychart regarding this on 11/01/21, I was not aware of that prior to this message.

## 2021-11-07 NOTE — Telephone Encounter (Signed)
Yes

## 2021-11-07 NOTE — Telephone Encounter (Signed)
I sent in on 11/01/21- did he get that rx?

## 2021-11-13 NOTE — Telephone Encounter (Signed)
Can you close this please? It says I don't have permission to refuse/close.

## 2021-11-26 ENCOUNTER — Ambulatory Visit: Payer: 59 | Admitting: Family Medicine

## 2021-11-26 NOTE — Telephone Encounter (Signed)
Can you close this please? It says I don't have permission to refuse/close.

## 2021-12-09 NOTE — Progress Notes (Incomplete)
? ?Phone [phone] ?In person visit ?  ?Subjective:  ? ?Benjamin [name] is a 31 y.o. year old very pleasant male patient who presents for/with See problem oriented charting ?No chief complaint on file. ? ? ?This visit occurred during the SARS-CoV-2 public health emergency.  Safety protocols were in place, including screening questions prior to the visit, additional usage of staff PPE, and extensive cleaning of exam room while observing appropriate contact time as indicated for disinfecting solutions.  ? ?Past Medical History-  ?Patient Active Problem List  ? Diagnosis Date Noted  ? Somatic dysfunction of spine, cervical 04/30/2021  ? Chronic neck pain 01/16/2021  ? Ulcerative colitis ([place]) 04/07/2019  ? White coat syndrome without diagnosis of hypertension 07/31/2018  ? Factor V Leiden mutation ([place]) 11/28/2016  ? Asthma   ? Generalized anxiety disorder 02/20/2013  ? Attention deficit disorder (ADD) in adult 05/25/2012  ? ? ?Medications- reviewed and updated ?Current Outpatient Medications  ?Medication Sig Dispense Refill  ? busPIRone (BUSPAR) 10 MG tablet Take 10 mg by mouth daily.    ? escitalopram (LEXAPRO) 10 MG tablet TAKE 1 TABLET(10 MG) BY MOUTH DAILY 90 tablet 2  ? hydrOXYzine (ATARAX/VISTARIL) 25 MG tablet SMARTSIG:0.5 Tablet(s) By Mouth Every Evening    ? methylphenidate (RITALIN) 10 MG tablet Take 1 tablet (10 mg total) by mouth 3 (three) times daily with meals. Fill in 1 month 90 tablet 0  ? methylphenidate (RITALIN) 10 MG tablet Take 1 tablet (10 mg total) by mouth 3 (three) times daily with meals. Fill in 2 months 90 tablet 0  ? methylphenidate (RITALIN) 10 MG tablet Take 1 tablet (10 mg total) by mouth 3 (three) times daily with meals. May fill today 90 tablet 0  ? ?No current facility-administered medications for this visit.  ? ?  ?Objective:  ?There were no vitals taken for this visit. ?Gen: NAD, resting comfortably ?CV: RRR no murmurs rubs or gallops ?Lungs: CTAB no crackles, wheeze,  rhonchi ?Abdomen: soft/nontender/nondistended/normal bowel sounds. No rebound or guarding.  ?Ext: no edema ?Skin: warm, dry ?Neuro: grossly normal, moves all extremities ? ?*** ?  ? ?Assessment and Plan  ? 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 ?#White Coat Syndrome without hypertension ?S: In 2020 discovered patient was taking intermittent decongestants and regular Afrin-we recommended discontinuation. He declined ENT when initially discovered.  ?Home readings #s: *** ?BP Readings from Last 3 Encounters:  ?08/05/21 (!) 156/98  ?06/25/21 (!) 194/148  ?04/30/21 (!) 150/90  ?A/P: *** ? ?#Adult ADD ?S: Compliant with Ritalin 10 mg twice daily. Depression with extended release version in the past.  ?-Diagn[facility] 2011 by [facility] ?-[place]CC[place] reviewed today ***- no high risk use ?-UDS April 28, 2019*** ?- Controlled substance contract March 26, 2018 ?A/P: ***  ? ?#Generalized anxiety disorder ?S: Patient remains on escitalopram 10 mg. Was not on Zoloft in the past for depression and GAD-diagnosed in high school-admits several other failures.. Felt mentally slowed on 20 mg Lexapro. ?A/P: ***  ? ?#Asthma-rare albuterol in the past with exercise. Not using as of 2020 ? ?#Factor V Leiden mutation-heterozygous ?S: Patient does not want lifelong anticoagulation unless DVT or PE. Dr. Beryle Beams actually wrote the prescription for the original test- he has not formally seen patient in the office today-he was sent a copy of the report though. ?A/P: *** ? ? ?# Ulcerative Colitis-follow-up with Dr. Watt Climes of the [facility] ?S: Patient was  referred to [facility] due to recurrent abdominal issues as well as elevated ESR and CRP. Formally diagnosed on colonoscopy March 25, 2019. ?A/P: ***  ? ?Health Maintenance Due  ?Topic Date Due  ? Hepatitis C Screening  Never done  ? COVID-19 Vaccine (4 - Booster for Pfizer series) 05/28/2021   ? ?Recommended follow up: No follow-ups on file. ?Future Appointments  ?Date Time Provider [place]  ?12/31/2021  3:40 PM Hunter, Brayton Mars, MD [facility]  ? ? ?Lab/Order associations: ?No diagnosis found. ? ?No orders of the defined types were placed in this encounter. ? ? ?I,Jada Bradford,acting as a scribe for Garret Reddish, MD.,have documented all relevant documentation on the behalf of Garret Reddish, MD,as directed by  Garret Reddish, MD while in the presence of Garret Reddish, MD. ? ?*** ?Return precautions advised.  ?[name] ? ? ?

## 2021-12-13 ENCOUNTER — Encounter: Payer: Self-pay | Admitting: Family Medicine

## 2021-12-13 ENCOUNTER — Ambulatory Visit: Payer: 59 | Admitting: Family Medicine

## 2021-12-13 VITALS — BP 150/100 | HR 98 | Temp 98.4°F | Ht 70.0 in | Wt 197.4 lb

## 2021-12-13 DIAGNOSIS — F988 Other specified behavioral and emotional disorders with onset usually occurring in childhood and adolescence: Secondary | ICD-10-CM

## 2021-12-13 DIAGNOSIS — Z79899 Other long term (current) drug therapy: Secondary | ICD-10-CM

## 2021-12-13 MED ORDER — METHYLPHENIDATE HCL 10 MG PO TABS: 10.0000 mg | ORAL_TABLET | Freq: Three times a day (TID) | ORAL | 0 refills | Status: DC

## 2021-12-13 MED ORDER — ESCITALOPRAM OXALATE 10 MG PO TABS: 15.0000 mg | ORAL_TABLET | Freq: Every day | ORAL | 5 refills | Status: DC

## 2021-12-13 NOTE — Patient Instructions (Addendum)
Please stop by lab before you go ?If you have mychart- we will send your results within 3 business days of [place] receiving them.  ?If you do not have mychart- we will call you about results within 5 business days of [place] receiving them.  ?*please also note that you will see labs on mychart as soon as they post. I will later go in and write notes on them- will say "notes from Dr. Yong Channel"  ? ?titrate to 15 mg lexapro (didn't tolerate 10m in the past and follow up 4-6 weeks)  ? ?Have to stop the afrin- we need blood pressure at home <135/85 to continue the ritalin or we need to look at medicine for BP if you do not think you can tolerate being off the ritalin ?- update me in 2 weeks with home blood pressure ? ?Any thoughts of self harm contact [place] call [phone] or [phone] ? ?Recommended follow up: Return in about 4 weeks (around 01/10/2022) for follow up- or sooner if needed. ? ?

## 2021-12-13 NOTE — Progress Notes (Signed)
?Phone [phone] ?In person visit ?  ?Subjective:  ? ?Benjamin [name] is a 31 y.o. year old very pleasant male patient who presents for/with See problem oriented charting ?Chief Complaint  ?Patient presents with  ? Follow-up  ? ADD  ?  Pt would like to discuss anti depressant.  ? ?This visit occurred during the SARS-CoV-2 public health emergency.  Safety protocols were in place, including screening questions prior to the visit, additional usage of staff PPE, and extensive cleaning of exam room while observing appropriate contact time as indicated for disinfecting solutions.  ? ?Past Medical History-  ?Patient Active Problem List  ? Diagnosis Date Noted  ? Ulcerative colitis ([place]) 04/07/2019  ?  Priority: High  ? Factor V Leiden mutation ([place]) 11/28/2016  ?  Priority: High  ? Attention deficit disorder (ADD) in adult 05/25/2012  ?  Priority: High  ? White coat syndrome without diagnosis of hypertension 07/31/2018  ?  Priority: Medium   ? Asthma   ?  Priority: Medium   ? Generalized anxiety disorder 02/20/2013  ?  Priority: Medium   ? Somatic dysfunction of spine, cervical 04/30/2021  ? Chronic neck pain 01/16/2021  ? ? ?Medications- reviewed and updated ?Current Outpatient Medications  ?Medication Sig Dispense Refill  ? busPIRone (BUSPAR) 10 MG tablet Take 10 mg by mouth daily.    ? escitalopram (LEXAPRO) 10 MG tablet Take 1.5 tablets (15 mg total) by mouth daily. 135 tablet 5  ? hydrOXYzine (ATARAX/VISTARIL) 25 MG tablet SMARTSIG:0.5 Tablet(s) By Mouth Every Evening    ? methylphenidate (RITALIN) 10 MG tablet Take 1 tablet (10 mg total) by mouth 3 (three) times daily with meals. Fill in 1 month 90 tablet 0  ? methylphenidate (RITALIN) 10 MG tablet Take 1 tablet (10 mg total) by mouth 3 (three) times daily with meals. Fill in 2 months 90 tablet 0  ? methylphenidate (RITALIN) 10 MG tablet Take 1 tablet (10 mg total) by mouth 3 (three) times daily with meals. May fill today 90 tablet 0  ? ?No current  facility-administered medications for this visit.  ? ?  ?Objective:  ?BP (!) 150/100   Pulse 98   Temp 98.4 ?F (36.9 ?C)   Ht 5' 10"  (1.778 m)   Wt 197 lb 6.4 oz (89.5 kg)   SpO2 98%   BMI 28.32 kg/m?  ?Gen: NAD, resting comfortably ?CV: RRR no murmurs rubs or gallops ?Lungs: CTAB no crackles, wheeze, rhonchi ?Ext: no edema ?Skin: warm, dry ?  ? ?Assessment and Plan  ? 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  ?- working full time in Print production planner.  ?- Also bought a house prior to October visit.  ?-going through a breakup right now- 6 years together- really hard on him ? ?#White Coat Syndrome without hypertension ?S: In 2020 discovered patient was taking intermittent decongestants and regular Afrin-we recommended discontinuation.  He declined ENT when initially discovered.  ?-unfortunately has restarted afrin ?BP Readings from Last 3 Encounters:  ?12/13/21 (!) 150/100  ?08/05/21 (!) 156/98  ?06/25/21 (!) 194/148 ED finger injury  ?Home blood pressures:  130s-150s/90s but as low as 120/80 but using afrin ?A/P: mild poor control at home while using afrin- he wants to remain off meds for now but I asked him to update me in 2 weeks- needs to be off afrin and BP <135/85 to not diagnose hypertension and start meds- still anxious about him being on ritalin with BP  continuing to stay high- may need to d/c this ? ?#Adult ADD ?S: Compliant with Ritalin 10 mg 3x daily. Depression with extended release version in the past.  ?-Diagnosed 2011 by [facility] ?-[place] reviewed today again- no high risk use- last refill late january ?-UDS  09/06/20- due today ?- Controlled substance contract March 26, 2018 ?A/P:  ADD well controlled- continue current medication- but anxious about BP as above ?- update UDS today  ? ?#Generalized anxiety disorder/depression ?S: Patient remains on escitalopram 10 mg (at 41m felt mentally  slowed).  Also on buspirone 10 mg twice daily and hydroxyzine helps at night. Was not on Zoloft in the past for depression and GAD-diagnosed in high school-admits several other failures..  Felt mentally slowed on 20 mg Lexapro. ?-was working with [facility] through his insurance last visit- seeing NP and therapist. They also had him on buspirone.  ?-now [place] [facility] [place] lauren- 3x a week for 2 weeks - helpful so far ?-also joined gym and cut down on drinking ?[facility]13[facility]20[facility] 08/05/2021 09/06/2020  ?Decreased Interest 3 1 0  ?Down, Depressed, Hopeless 3 1 0  ?PHQ - 2 Score 6 2 0  ?Altered sleeping 3 2 -  ?Tired, decreased energy 3 2 -  ?Change in appetite 3 0 -  ?Feeling bad or failure about yourself  3 1 -  ?Trouble concentrating 3 1 -  ?Moving slowly or fidgety/restless 0 0 -  ?Suicidal thoughts 1 0 -  ?PHQ-9 Score 22 8 -  ?Difficult doing work/chores Very difficult Somewhat difficult -  ?- thoughts of being better off dead but not harming himself ?A/P: history GAD but now with depression (may be situational with recent breakfup). Both poorly controlled- titrate to 15 mg lexapro (didn't tolerate 278min the past and follow up 4-6 weeks)  ?-has cut down on alchol to 7 a week- encouraged full cessation and even consider AA ? ?Recommended follow up: Return in about 4 weeks (around 01/10/2022) for follow up- or sooner if needed. ?Future Appointments  ?Date Time Provider [place]?12/31/2021  3:40 PM HuYong ChannelStBrayton MarsMD [facility]  ? ?Lab/Order associations: ?  ICD-10-CM   ?1. Attention deficit disorder (ADD) in adult  F98.8 DRUG MONITORING, PANEL 8 WITH CONFIRMATION, URINE  ?  ?2. High risk medication use  Z79.899 DRUG MONITORING, PANEL 8 WITH CONFIRMATION, URINE  ?  ? ? ?Meds ordered this encounter  ?Medications  ? escitalopram (LEXAPRO) 10 MG tablet  ?  Sig: Take 1.5 tablets (15 mg total) by mouth daily.  ?  Dispense:  135 tablet  ?  Refill:  5  ?  methylphenidate (RITALIN) 10 MG tablet  ?  Sig: Take 1 tablet (10 mg total) by mouth 3 (three) times daily with meals. Fill in 1 month  ?  Dispense:  90 tablet  ?  Refill:  0  ? methylphenidate (RITALIN) 10 MG tablet  ?  Sig: Take 1 tablet (10 mg total) by mouth 3 (three) times daily with meals. Fill in 2 months  ?  Dispense:  90 tablet  ?  Refill:  0  ? methylphenidate (RITALIN) 10 MG tablet  ?  Sig: Take 1 tablet (10 mg total) by mouth 3 (three) times daily with meals. May fill today  ?  Dispense:  90 tablet  ?  Refill:  0  ? ? ?Return precautions advised.  ?StGarret ReddishMD ? ?

## 2021-12-15 LAB — DRUG MONITORING, PANEL 8 WITH CONFIRMATION, URINE
6 Acetylmorphine: NEGATIVE ng/mL (ref <10)
Alcohol Metabolites: POSITIVE ng/mL — AB (ref <500)
Amphetamines: NEGATIVE ng/mL (ref <500)
Benzodiazepines: NEGATIVE ng/mL (ref <100)
Buprenorphine, Urine: NEGATIVE ng/mL (ref <5)
Cocaine Metabolite: NEGATIVE ng/mL (ref <150)
Creatinine: 186.1 mg/dL (ref >=20.0)
Ethyl Glucuronide (ETG): >10000 ng/mL — ABNORMAL HIGH (ref <500)
Ethyl Sulfate (ETS): >10000 ng/mL — ABNORMAL HIGH (ref <100)
MDMA: NEGATIVE ng/mL (ref <500)
Marijuana Metabolite: NEGATIVE ng/mL (ref <20)
Opiates: NEGATIVE ng/mL (ref <100)
Oxidant: NEGATIVE ug/mL (ref <200)
Oxycodone: NEGATIVE ng/mL (ref <100)
pH: 6.9 (ref 4.5–9.0)

## 2021-12-15 LAB — DM TEMPLATE

## 2021-12-30 NOTE — Progress Notes (Incomplete)
? ?Phone [phone] ?In person visit ?  ?Subjective:  ? ?Benjamin [name] is a 31 y.o. year old very pleasant male patient who presents for/with See problem oriented charting ?No chief complaint on file. ? ? ?This visit occurred during the SARS-CoV-2 public health emergency.  Safety protocols were in place, including screening questions prior to the visit, additional usage of staff PPE, and extensive cleaning of exam room while observing appropriate contact time as indicated for disinfecting solutions.  ? ?Past Medical History-  ?Patient Active Problem List  ? Diagnosis Date Noted  ? Somatic dysfunction of spine, cervical 04/30/2021  ? Chronic neck pain 01/16/2021  ? Ulcerative colitis ([place]) 04/07/2019  ? White coat syndrome without diagnosis of hypertension 07/31/2018  ? Factor V Leiden mutation ([place]) 11/28/2016  ? Asthma   ? Generalized anxiety disorder 02/20/2013  ? Attention deficit disorder (ADD) in adult 05/25/2012  ? ? ?Medications- reviewed and updated ?Current Outpatient Medications  ?Medication Sig Dispense Refill  ? busPIRone (BUSPAR) 10 MG tablet Take 10 mg by mouth daily.    ? escitalopram (LEXAPRO) 10 MG tablet Take 1.5 tablets (15 mg total) by mouth daily. 135 tablet 5  ? hydrOXYzine (ATARAX/VISTARIL) 25 MG tablet SMARTSIG:0.5 Tablet(s) By Mouth Every Evening    ? methylphenidate (RITALIN) 10 MG tablet Take 1 tablet (10 mg total) by mouth 3 (three) times daily with meals. Fill in 1 month 90 tablet 0  ? methylphenidate (RITALIN) 10 MG tablet Take 1 tablet (10 mg total) by mouth 3 (three) times daily with meals. Fill in 2 months 90 tablet 0  ? methylphenidate (RITALIN) 10 MG tablet Take 1 tablet (10 mg total) by mouth 3 (three) times daily with meals. May fill today 90 tablet 0  ? ?No current facility-administered medications for this visit.  ? ?  ?Objective:  ?There were no vitals taken for this visit. ?Gen: NAD, resting comfortably ?CV: RRR no murmurs rubs or gallops ?Lungs: CTAB no  crackles, wheeze, rhonchi ?Abdomen: soft/nontender/nondistended/normal bowel sounds. No rebound or guarding.  ?Ext: no edema ?Skin: warm, dry ?Neuro: grossly normal, moves all extremities ? ?*** ?  ? ?Assessment and Plan  ? 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 ?#White Coat Syndrome without hypertension ?S: In 2020 discovered patient was taking intermittent decongestants and regular Afrin-we recommended discontinuation. He declined ENT when initially discovered.  ?Home readings #s: *** ?BP Readings from Last 3 Encounters:  ?12/13/21 (!) 150/100  ?08/05/21 (!) 156/98  ?06/25/21 (!) 194/148  ?A/P: *** ? ?#Adult ADD ?S: Compliant with Ritalin 10 mg twice daily. Depression with extended release version in the past.  ?-Diagnosed 2011 by [facility] ?-[place]CC[place] reviewed today ***- no high risk use ?-UDS April 28, 2019 ?- Controlled substance contract March 26, 2018 ?A/P: ***  ? ?#Generalized anxiety disorder ?S: Patient remains on escitalopram 10 mg. Was not on Zoloft in the past for depression and GAD-diagnosed in high school-admitted several other failures.. Felt mentally slowed on 20 mg Lexapro. ?A/P: ***  ? ?#Asthma-rare albuterol in the past with exercise. Not using as of 2020 ? ?#Factor V Leiden mutation-heterozygous ?S: Patient does not want lifelong anticoagulation unless DVT or PE. Dr. Beryle Beams actually wrote the prescription for the original test- he has not formally seen patient in the office today-he was sent a copy of the report though. ?A/P: *** ? ?#Ulcerative Colitis-follow-up with Dr. Watt Climes of the [facility] ?S: Patient was  referred to [facility] due to recurrent abdominal issues as well as elevated ESR and CRP. Formally diagnosed on colonoscopy March 25, 2019.  ?A/P: ***  ? ?Health Maintenance Due  ?Topic Date Due  ? Hepatitis C Screening  Never done  ? COVID-19 Vaccine (4 - Booster for Pfizer series)  05/28/2021  ? ?Recommended follow up: No follow-ups on file. ?Future Appointments  ?Date Time Provider [place]  ?01/09/2022 11:00 AM Yong Channel Brayton Mars, MD [facility]  ? ? ?Lab/Order associations: ?No diagnosis found. ? ?No orders of the defined types were placed in this encounter. ? ? ?Return precautions advised.  ?Benjamin [name] ? ? ?

## 2021-12-31 ENCOUNTER — Ambulatory Visit: Payer: 59 | Admitting: Family Medicine

## 2021-12-31 DIAGNOSIS — K5190 Ulcerative colitis, unspecified, without complications: Secondary | ICD-10-CM

## 2021-12-31 DIAGNOSIS — R030 Elevated blood-pressure reading, without diagnosis of hypertension: Secondary | ICD-10-CM

## 2021-12-31 DIAGNOSIS — F411 Generalized anxiety disorder: Secondary | ICD-10-CM

## 2021-12-31 DIAGNOSIS — F988 Other specified behavioral and emotional disorders with onset usually occurring in childhood and adolescence: Secondary | ICD-10-CM

## 2022-01-09 ENCOUNTER — Ambulatory Visit: Payer: 59 | Admitting: Family Medicine

## 2022-01-09 DIAGNOSIS — F988 Other specified behavioral and emotional disorders with onset usually occurring in childhood and adolescence: Secondary | ICD-10-CM

## 2022-01-09 DIAGNOSIS — F411 Generalized anxiety disorder: Secondary | ICD-10-CM

## 2022-01-09 DIAGNOSIS — R030 Elevated blood-pressure reading, without diagnosis of hypertension: Secondary | ICD-10-CM

## 2022-01-09 DIAGNOSIS — D6851 Activated protein C resistance: Secondary | ICD-10-CM

## 2022-01-09 DIAGNOSIS — K5190 Ulcerative colitis, unspecified, without complications: Secondary | ICD-10-CM

## 2022-02-03 ENCOUNTER — Encounter: Payer: Self-pay | Admitting: Family Medicine

## 2022-02-03 ENCOUNTER — Ambulatory Visit: Payer: 59 | Admitting: Family Medicine

## 2022-02-03 VITALS — BP 120/80 | HR 94 | Temp 98.3°F | Ht 70.0 in | Wt 201.4 lb

## 2022-02-03 DIAGNOSIS — R0981 Nasal congestion: Secondary | ICD-10-CM

## 2022-02-03 DIAGNOSIS — F411 Generalized anxiety disorder: Secondary | ICD-10-CM

## 2022-02-03 DIAGNOSIS — F988 Other specified behavioral and emotional disorders with onset usually occurring in childhood and adolescence: Secondary | ICD-10-CM | POA: Diagnosis not present

## 2022-02-03 DIAGNOSIS — K5190 Ulcerative colitis, unspecified, without complications: Secondary | ICD-10-CM | POA: Diagnosis not present

## 2022-02-03 DIAGNOSIS — T485X5A Adverse effect of other anti-common-cold drugs, initial encounter: Secondary | ICD-10-CM

## 2022-02-03 DIAGNOSIS — D6851 Activated protein C resistance: Secondary | ICD-10-CM

## 2022-02-03 MED ORDER — HYDROXYZINE HCL 25 MG PO TABS: 12.5000 mg | ORAL_TABLET | Freq: Every evening | ORAL | 5 refills | Status: DC | PRN

## 2022-02-03 MED ORDER — BUSPIRONE HCL 10 MG PO TABS: 10.0000 mg | ORAL_TABLET | Freq: Two times a day (BID) | ORAL | 3 refills | Status: DC

## 2022-02-03 NOTE — Patient Instructions (Addendum)
Since we have tried several things and not found the right balance yet for you between ADD, anxiety, depression- I would like for psychiatry to weigh in- please let me know if any of these locations need formal referral and I will place this for you- can just mychart me ? ?Dr. Darleene Cleaver ?[address] ?[address] ?[place], [place] [address] ?([phone] ? ?[email or web address] ? ?[email or web address] ? ?We will call you within two weeks about your referral to ENT. If you do not hear within 2 weeks, give [place] a call.  ? ?-if thoughts of self harm knows to call [phone] or [phone] ? ? ?Recommended follow up: Return in about 2 months (around 04/05/2022) for followup or sooner if needed.Schedule b4 you leave. This is in case you are not seeing psychiatry and also to monitor blood pressure ?

## 2022-02-03 NOTE — Progress Notes (Signed)
?Phone [phone] ?In person visit ?  ?Subjective:  ? ?Benjamin [name] is a 31 y.o. year old very pleasant male patient who presents for/with See problem oriented charting ?Chief Complaint  ?Patient presents with  ? Follow-up  ? ADD  ? Hypertension  ?  Pt states he has white coat and gets normal readings, last night bp at home was 120/80.  ? ? ?Past Medical History-  ?Patient Active Problem List  ? Diagnosis Date Noted  ? Ulcerative colitis ([place]) 04/07/2019  ?  Priority: High  ? Factor V Leiden mutation ([place]) 11/28/2016  ?  Priority: High  ? Attention deficit disorder (ADD) in adult 05/25/2012  ?  Priority: High  ? White coat syndrome without diagnosis of hypertension 07/31/2018  ?  Priority: Medium   ? Asthma   ?  Priority: Medium   ? Generalized anxiety disorder 02/20/2013  ?  Priority: Medium   ? Somatic dysfunction of spine, cervical 04/30/2021  ? Chronic neck pain 01/16/2021  ? ? ?Medications- reviewed and updated ?Current Outpatient Medications  ?Medication Sig Dispense Refill  ? escitalopram (LEXAPRO) 10 MG tablet Take 1.5 tablets (15 mg total) by mouth daily. 135 tablet 5  ? busPIRone (BUSPAR) 10 MG tablet Take 1 tablet (10 mg total) by mouth 2 (two) times daily. 180 tablet 3  ? hydrOXYzine (ATARAX) 25 MG tablet Take 0.5-1 tablets (12.5-25 mg total) by mouth at bedtime as needed. 30 tablet 5  ? methylphenidate (RITALIN) 10 MG tablet Take 1 tablet (10 mg total) by mouth 3 (three) times daily with meals. Fill in 1 month 90 tablet 0  ? methylphenidate (RITALIN) 10 MG tablet Take 1 tablet (10 mg total) by mouth 3 (three) times daily with meals. Fill in 2 months 90 tablet 0  ? methylphenidate (RITALIN) 10 MG tablet Take 1 tablet (10 mg total) by mouth 3 (three) times daily with meals. May fill today 90 tablet 0  ? ?No current facility-administered medications for this visit.  ? ?  ?Objective:  ?BP 120/80 Comment: most recent home reading. white coat htn 172/120 in office.  Pulse 94   Temp 98.3 ?F  (36.8 ?C)   Ht 5' 10"  (1.778 m)   Wt 201 lb 6.4 oz (91.4 kg)   SpO2 98%   BMI 28.90 kg/m?  ?Gen: NAD, resting comfortably ?CV: RRR no murmurs rubs or gallops ?Lungs: CTAB no crackles, wheeze, rhonchi ?Ext: no edema ?Skin: warm, dry ?  ? ?Assessment and Plan  ? ?# social update ?- restarting full time [employer] for business ?-working full time in financial planning/investing ?-enjoying home he bought in 2022 ? ?#White Coat Syndrome without hypertension ?S: In 2020 discovered patient was taking intermittent decongestants and regular Afrin-we recommended discontinuation.  He declined ENT when initially discovered. Has intermittently used afrin since that time and have strongly encouraged full cessation due to risks and elevated BP- he has not been successful ? ?Home BP readings: checks bp at home usually 120-130 over 80-90 at home in evenings. Can run higher in day if anxious.  ?BP Readings from Last 3 Encounters:  ?02/03/22 120/80  ?12/13/21 (!) 150/100  ?08/05/21 (!) 156/98  ?A/P: blood pressure controlled for most part on home readings- hold off on true HTN diagnose ? ?#Adult ADD ?S: Compliant with Ritalin 10 mg twice daily. Depression with extended release version in the past.  ?-Diagnosed 2011 by Camillia Herter, [facility] ?-NCCSRS/PDMP reviewed today - no high risk use noted ?-  UDS December 13 2021- only alcohol noted- no amphetamines  ?- Controlled substance contract March 26, 2018 ? ?Has only filled once on 12/13/21- has 2 rx remaining ?A/P: ADD stable- blood pressure better when anxiety controlled- willing to refill another 3 months and hoping for psychiatry input for balance between ADD and GAD/depresison  ? ?#Generalized anxiety disorder ?S: medication: escitalopram 15 mg. Felt mentally slowed on 20 mg Lexapro. Out of buspirone for a few days and when restarted noted improvement- 10 mg twice a day ?- waking up every hour to 2 hours even with hydroxyzine to get to  sleep (through [employer] NP with psychiatry). Doesn't sleep even on weekends if doesn't take ritalin ?- Was not on Zoloft in the past for depression and GAD-diagnosed in high school-admits several other failures.  ?-seeing therapist twice a week still ?-he had to take a break from school and supposed to start back today ? ?-big breakup still weights heavily on him ? ?  02/03/2022  ?  1:37 PM 12/13/2021  ?  8:38 AM 03/08/2018  ? 10:29 AM 07/20/2017  ?  1:35 PM  ?GAD 7 : Generalized Anxiety Score  ?Nervous, Anxious, on Edge 3 3 1 2   ?Control/stop worrying 2 3 2 3   ?Worry too much - different things 2 3 1 2   ?Trouble relaxing 3 3 0 2  ?Restless 1 3 1 1   ?Easily annoyed or irritable 0 1 1 2   ?Afraid - awful might happen 1 3 1 2   ?Total GAD 7 Score 12 19 7 14   ?Anxiety Difficulty Very difficult Very difficult Somewhat difficult Somewhat difficult  ? ? ?  02/03/2022  ?  1:41 PM 12/13/2021  ?  8:37 AM 08/05/2021  ?  5:19 PM  ?Depression screen PHQ 2/9  ?Decreased Interest 0 3 1  ?Down, Depressed, Hopeless 2 3 1   ?PHQ - 2 Score 2 6 2   ?Altered sleeping 3 3 2   ?Tired, decreased energy 3 3 2   ?Change in appetite 0 3 0  ?Feeling bad or failure about yourself  2 3 1   ?Trouble concentrating 0 3 1  ?Moving slowly or fidgety/restless 0 0 0  ?Suicidal thoughts 1 1 0  ?PHQ-9 Score 11 22 8   ?Difficult doing work/chores Very difficult Very difficult Somewhat difficult  ?- fleeting thoughts of self harm- has 988 # but they have never bene progressive ? ?A/P: anxiety appears improved as does depression but still rather pervasive on effects of life ?-big break up large part ?- lexapro 15 mg seems helpful but hasnt tolerated higher doses in past nad failed other SSRIs ?-buspirone helpful 10 mg twice daily ?- discussed psychiatry as next step- he is in agreement- gave some names/numbers- and we can enter formal referral if needed ?-if thoughts of self harm knows to call 911 or [phone] ? ?# Nasal congestion ?S:medication: afrin down to 4-7x a  day though have encouraged discontinuation (still down from 10)  ?A/P: we have tried to wean off without success. He asks for ENT referral- reasonable and placed today ?-afrin could contribute to anxiety and higher BPs and poor sleep  ? ?#Factor V Leiden mutation-heterozygous ?S: Patient does not want lifelong anticoagulation unless DVT or PE.  Dr. Beryle Beams actually wrote the prescription for the original test- he has not formally seen patient in the office today-he was sent a copy of the report though. ?- no leg swelling or shortness of breath reported ?A/P: no evidence of DVT or PE on exam  today- continue to monitor ? ?% Ulcerative Colitis-follow-up with Dr. Watt Climes of the [facility] ?S: Patient was referred to [facility] due to recurrent abdominal issues as well as elevated ESR and CRP.  Formally diagnosed on colonoscopy March 25, 2019. He  has done well without meds for about 2 years (had been on short term after colonoscopy- no recent rectal bleeding or abdominal cramping ?A/P: appears to be controlled at least symptom wise without meds- continue to monitor nad follow up with Dr. Watt Climes as needed- check labs at least annually  ? ?Recommended follow up: Return in about 2 months (around 04/05/2022) for followup or sooner if needed.Schedule b4 you leave. ? ?Lab/Order associations: ?  ICD-10-CM   ?1. Nasal congestion due to prolonged use of decongestants  R09.81 Ambulatory referral to ENT  ? T48.5X5A   ?  ? ? ?Meds ordered this encounter  ?Medications  ? busPIRone (BUSPAR) 10 MG tablet  ?  Sig: Take 1 tablet (10 mg total) by mouth 2 (two) times daily.  ?  Dispense:  180 tablet  ?  Refill:  3  ? hydrOXYzine (ATARAX) 25 MG tablet  ?  Sig: Take 0.5-1 tablets (12.5-25 mg total) by mouth at bedtime as needed.  ?  Dispense:  30 tablet  ?  Refill:  5  ? ? ?Return precautions advised.  ?Garret Reddish, MD ? ?

## 2022-04-01 ENCOUNTER — Ambulatory Visit: Payer: 59 | Admitting: Family Medicine

## 2022-05-13 ENCOUNTER — Other Ambulatory Visit: Payer: Self-pay | Admitting: Family Medicine

## 2022-05-13 MED ORDER — METHYLPHENIDATE HCL 10 MG PO TABS: 10.0000 mg | ORAL_TABLET | Freq: Three times a day (TID) | ORAL | 0 refills | Status: DC

## 2022-06-30 ENCOUNTER — Encounter: Payer: Self-pay | Admitting: *Deleted

## 2022-07-08 ENCOUNTER — Other Ambulatory Visit: Payer: Self-pay | Admitting: Family Medicine

## 2022-07-09 MED ORDER — METHYLPHENIDATE HCL 10 MG PO TABS: 10.0000 mg | ORAL_TABLET | Freq: Three times a day (TID) | ORAL | 0 refills | Status: DC

## 2022-09-17 DIAGNOSIS — F4323 Adjustment disorder with mixed anxiety and depressed mood: Secondary | ICD-10-CM | POA: Diagnosis not present

## 2022-09-18 ENCOUNTER — Encounter: Payer: Self-pay | Admitting: *Deleted

## 2022-09-30 ENCOUNTER — Other Ambulatory Visit: Payer: Self-pay | Admitting: Family Medicine

## 2022-10-08 DIAGNOSIS — F4323 Adjustment disorder with mixed anxiety and depressed mood: Secondary | ICD-10-CM | POA: Diagnosis not present

## 2022-10-16 DIAGNOSIS — M6281 Muscle weakness (generalized): Secondary | ICD-10-CM | POA: Diagnosis not present

## 2022-10-16 DIAGNOSIS — M542 Cervicalgia: Secondary | ICD-10-CM | POA: Diagnosis not present

## 2022-10-21 ENCOUNTER — Telehealth: Payer: Self-pay | Admitting: *Deleted

## 2022-10-21 NOTE — Progress Notes (Signed)
Chief Complaint  Patient presents with   New Patient (Initial Visit)    HTN   History of Present Illness: 32 yo male with history of anxiety, depression and asthma who is here today as a new patient for the evaluation of HTN. He was at the surgery center on 10/21/22 for sinus surgery and his BP was 921 systolic. The procedure was cancelled. His mother sees Dr. Acie Fredrickson and asked that he be seen here today to discuss his BP. He tells me that he feels fine today. No chest pain or dyspnea. No LE edema. No palpitations. No dizziness, near syncope, visual changes. He reports having "white coat HTN".   Primary Care Physician: Marin Olp, MD   Past Medical History:  Diagnosis Date   ADHD    Allergy    Anxiety    Asthma    only before sports   Depression    Factor 5 Leiden mutation, heterozygous [facility])     Past Surgical History:  Procedure Laterality Date   none    None  Current Outpatient Medications  Medication Sig Dispense Refill   busPIRone (BUSPAR) 10 MG tablet Take 1 tablet (10 mg total) by mouth 2 (two) times daily. 180 tablet 3   escitalopram (LEXAPRO) 10 MG tablet Take 1.5 tablets (15 mg total) by mouth daily. 135 tablet 5   hydrOXYzine (ATARAX) 25 MG tablet TAKE 1/2 TO 1 TABLET(12.5 TO 25 MG) BY MOUTH AT BEDTIME AS NEEDED 30 tablet 5   losartan (COZAAR) 25 MG tablet Take 1 tablet (25 mg total) by mouth daily. 90 tablet 3   methylphenidate (RITALIN) 10 MG tablet Take 1 tablet (10 mg total) by mouth 3 (three) times daily with meals. May fill today 90 tablet 0   methylphenidate (RITALIN) 10 MG tablet Take 1 tablet (10 mg total) by mouth 3 (three) times daily with meals. Fill in 2 months 90 tablet 0   methylphenidate (RITALIN) 10 MG tablet Take 1 tablet (10 mg total) by mouth 3 (three) times daily with meals. Fill in 1 month 90 tablet 0   No current facility-administered medications for this visit.    Allergies  Allergen Reactions   Penicillins     Social History    Socioeconomic History   Marital status: Single    Spouse name: Not on file   Number of children: Not on file   Years of education: Not on file   Highest education level: Not on file  Occupational History   Occupation: financial advisor  Tobacco Use   Smoking status: Never   Smokeless tobacco: Never  Substance and Sexual Activity   Alcohol use: Yes    Alcohol/week: 14.0 standard drinks of alcohol    Types: 14 Standard drinks or equivalent per week    Comment: Daily alcohol   Drug use: No   Sexual activity: Not on file  Other Topics Concern   Not on file  Social History Narrative   ** Merged History Encounter **       Single. Not dating.   Full time at [employer] for business.  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, avid Scientist, clinical (histocompatibility and immunogenetics), wants to play lacrosse again   Social Determinants of Radio broadcast assistant Strain: Not on [employer] Insecurity: Not on file  Transportation Needs: Not on file  Physical Activity: Not on file  Stress: Not on file  Social Connections:  Not on file  Intimate Partner Violence: Not on file    Family History  Problem Relation Age of Onset   Hypertension Mother        Has CAD   CAD Mother    Hypertension Father    Alcoholism Father    Alcoholism Paternal Grandfather     Review of Systems:  As stated in the HPI and otherwise negative.   BP (!) 220/140   Pulse 87   Ht 5\' 10"  (1.778 m)   Wt 94.6 kg   SpO2 99%   BMI 29.93 kg/m   Physical Examination: General: Well developed, well nourished, NAD  HEENT: OP clear, mucus membranes moist  SKIN: warm, dry. No rashes. Neuro: No focal deficits  Musculoskeletal: Muscle strength 5/5 all ext  Psychiatric: Mood and affect normal  Neck: No JVD, no carotid bruits, no thyromegaly, no lymphadenopathy.  Lungs:Clear bilaterally, no wheezes, rhonci, crackles Cardiovascular: Regular rate and rhythm. No murmurs, gallops or  rubs. Abdomen:Soft. Bowel sounds present. Non-tender.  Extremities: No lower extremity edema. Pulses are 2 + in the bilateral DP/PT.  EKG:  EKG is ordered today. The ekg ordered today demonstrates Sinus, LVH  Recent Labs: No results found for requested labs within last 365 days.   Lipid Panel    Component Value Date/Time   CHOL 254 (H) 08/05/2021 1653   TRIG 148.0 08/05/2021 1653   HDL 52.80 08/05/2021 1653   CHOLHDL 5 08/05/2021 1653   VLDL 29.6 08/05/2021 1653   LDLCALC 172 (H) 08/05/2021 1653     Wt Readings from Last 3 Encounters:  10/22/22 94.6 kg  02/03/22 91.4 kg  12/13/21 89.5 kg    Assessment and Plan:   1. HTN: I will have him follow his BP daily at home. Will start Losartan 25 mg daily. Will arrange an echo to assess LV function and exclude structural heart disease. BMET today.   Labs/ tests ordered today include:   Orders Placed This Encounter  Procedures   Basic Metabolic Panel (BMET)   EKG 12-Lead   ECHOCARDIOGRAM COMPLETE   Disposition:   F/U with me in 4 weeks  Signed, Lauree Chandler, MD, [facility] 10/22/2022 11:53 AM    [facility] [address], [place], [place]  [address] Phone: [phone]; Fax: [phone]

## 2022-10-21 NOTE — Telephone Encounter (Signed)
Received message from Dr. Acie Fredrickson requesting patient be seen as DOD/NEW PT as soon possible for hypertension.  He was at surgery center for a surgery this morning, found to have BPs:  186/149 208/145 191/128 210/153  Called patient.  He denies any headache, vision or speech changes, one sided weakness, dizziness.  Feels normal but BP is high.  Scheduled w the DOD tomorrow.

## 2022-10-22 ENCOUNTER — Encounter: Payer: Self-pay | Admitting: Cardiovascular Disease

## 2022-10-22 ENCOUNTER — Ambulatory Visit: Payer: BC Managed Care – PPO | Attending: Cardiovascular Disease | Admitting: Cardiovascular Disease

## 2022-10-22 VITALS — BP 220/140 | HR 87 | Ht 70.0 in | Wt 208.6 lb

## 2022-10-22 DIAGNOSIS — I10 Essential (primary) hypertension: Secondary | ICD-10-CM

## 2022-10-22 LAB — BASIC METABOLIC PANEL
BUN/Creatinine Ratio: 13 (ref 9–20)
BUN: 14 mg/dL (ref 6–20)
CO2: 25 mmol/L (ref 20–29)
Calcium: 10.2 mg/dL (ref 8.7–10.2)
Chloride: 100 mmol/L (ref 96–106)
Creatinine, Ser: 1.07 mg/dL (ref 0.76–1.27)
Glucose: 95 mg/dL (ref 70–99)
Potassium: 4.1 mmol/L (ref 3.5–5.2)
Sodium: 140 mmol/L (ref 134–144)
eGFR: 96 mL/min/{1.73_m2} (ref >59)

## 2022-10-22 MED ORDER — LOSARTAN POTASSIUM 25 MG PO TABS: 25.0000 mg | ORAL_TABLET | Freq: Every day | ORAL | 3 refills | Status: DC

## 2022-10-22 NOTE — Patient Instructions (Signed)
Medication Instructions:  Your physician has recommended you make the following change in your medication:  1.) start losartan 25 mg - take one tablet daily for blood pressure   *If you need a refill on your cardiac medications before your next appointment, please call your pharmacy*   Lab Work: Today: BMET If you have labs (blood work) drawn today and your tests are completely normal, you will receive your results only by: [place] (if you have MyChart) OR A paper copy in the mail If you have any lab test that is abnormal or we need to change your treatment, we will call you to review the results.   Testing/Procedures: Your physician has requested that you have an echocardiogram. Echocardiography is a painless test that uses sound waves to create images of your heart. It provides your doctor with information about the size and shape of your heart and how well your heart's chambers and valves are working. This procedure takes approximately one hour. There are no restrictions for this procedure. Please do NOT wear cologne, perfume, aftershave, or lotions (deodorant is allowed). Please arrive 15 minutes prior to your appointment time.   Follow-Up: At [facility], you and your health needs are our priority.  As part of our continuing mission to provide you with exceptional heart care, we have created designated Provider Care Teams.  These Care Teams include your primary Cardiologist (physician) and Advanced Practice Providers (APPs -  Physician Assistants and Nurse Practitioners) who all work together to provide you with the care you need, when you need it.   Your next appointment:   4 week(s)  Provider:   Lauree Chandler, MD

## 2022-10-29 DIAGNOSIS — F4323 Adjustment disorder with mixed anxiety and depressed mood: Secondary | ICD-10-CM | POA: Diagnosis not present

## 2022-10-30 DIAGNOSIS — M6281 Muscle weakness (generalized): Secondary | ICD-10-CM | POA: Diagnosis not present

## 2022-10-30 DIAGNOSIS — M542 Cervicalgia: Secondary | ICD-10-CM | POA: Diagnosis not present

## 2022-10-31 ENCOUNTER — Telehealth: Payer: Self-pay | Admitting: Cardiovascular Disease

## 2022-10-31 DIAGNOSIS — I10 Essential (primary) hypertension: Secondary | ICD-10-CM

## 2022-10-31 NOTE — Telephone Encounter (Signed)
Pt c/o BP issue: STAT if pt c/o blurred vision, one-sided weakness or slurred speech  1. What are your last 5 BP readings? Last night 9:pm 184/132; this morning  203/140  2. Are you having any other symptoms (ex. Dizziness, headache, blurred vision, passed out)? no  3. What is your BP issue?

## 2022-10-31 NOTE — Telephone Encounter (Signed)
Patient stated this morning was the first time he has taking his blood pressure since prescribed Losartan. Patient stated he took his blood pressure this morning 203/140 prior to taking his medication. He is currently asymptomatic , at work and is  unable to recheck his blood pressure at this time. Patient stated he will recheck his blood pressure while he is on his lunch break. Will forward this to Physician and the R.N.

## 2022-10-31 NOTE — Telephone Encounter (Signed)
  Benjamin Blanks, MD  You1 minute ago (11:01 AM)   Hey. He is going to need follow up in the HTN clinic to stay on top of this. [name]    Placed referral to HTN clinic and informed patient through portal as per his message he is at work and there are no changes at this time.

## 2022-11-11 ENCOUNTER — Ambulatory Visit (HOSPITAL_COMMUNITY): Payer: BC Managed Care – PPO | Attending: Internal Medicine

## 2022-11-11 DIAGNOSIS — I10 Essential (primary) hypertension: Secondary | ICD-10-CM | POA: Diagnosis not present

## 2022-11-11 LAB — ECHOCARDIOGRAM COMPLETE
Area-P 1/2: 3.37 cm2
S' Lateral: 4 cm

## 2022-11-13 ENCOUNTER — Telehealth: Payer: Self-pay | Admitting: *Deleted

## 2022-11-13 DIAGNOSIS — M542 Cervicalgia: Secondary | ICD-10-CM | POA: Diagnosis not present

## 2022-11-13 DIAGNOSIS — M6281 Muscle weakness (generalized): Secondary | ICD-10-CM | POA: Diagnosis not present

## 2022-11-13 DIAGNOSIS — I77810 Thoracic aortic ectasia: Secondary | ICD-10-CM

## 2022-11-13 NOTE — Telephone Encounter (Signed)
-----   Message from Burnell Blanks, MD sent at 11/12/2022  4:50 PM EST ----- His heart is strong. His heart muscle is a little thicker than normal. No significant valve disease. His aorta may be slightly dilated. He will need a chest CTA to make sure the aorta is not dilated. Thanks, chris

## 2022-11-13 NOTE — Telephone Encounter (Signed)
Spoke w patient and reviewed results.  Order placed for ct chest aorta.  Kidney function wnl on recent labs 10/22/22.  Pt aware he will be contacted to schedule.

## 2022-11-14 ENCOUNTER — Other Ambulatory Visit (HOSPITAL_COMMUNITY): Payer: BC Managed Care – PPO

## 2022-11-19 ENCOUNTER — Encounter: Payer: Self-pay | Admitting: Cardiovascular Disease

## 2022-11-19 ENCOUNTER — Ambulatory Visit: Payer: BC Managed Care – PPO | Attending: Cardiovascular Disease | Admitting: Cardiovascular Disease

## 2022-11-19 VITALS — BP 178/120 | HR 118 | Ht 70.0 in | Wt 209.6 lb

## 2022-11-19 DIAGNOSIS — I10 Essential (primary) hypertension: Secondary | ICD-10-CM | POA: Diagnosis not present

## 2022-11-19 DIAGNOSIS — I77810 Thoracic aortic ectasia: Secondary | ICD-10-CM | POA: Diagnosis not present

## 2022-11-19 MED ORDER — LOSARTAN POTASSIUM 50 MG PO TABS: 50.0000 mg | ORAL_TABLET | Freq: Every day | ORAL | 3 refills | Status: DC

## 2022-11-19 NOTE — Progress Notes (Signed)
Chief Complaint  Patient presents with   Follow-up    HTN   History of Present Illness: 32 yo male with history of HTN, anxiety, depression and asthma who is here today for follow up. I saw him as a new patient for the evaluation of HTN on 10/22/22. He was at the surgery center on 10/21/22 for sinus surgery and his BP was [id number] systolic. The procedure was cancelled. He had no complaints at his first visit here. He reports having "white coat HTN". I started him on Losartan 25 mg daily. Echo 11/11/22 with LVEF=50-55%. Moderate LVH. No valve disease.   He is here today for follow up. The patient denies any chest pain, dyspnea, palpitations, lower extremity edema, orthopnea, PND, dizziness, near syncope or syncope. His BP has been in the Q000111Q systolic at home.    Primary Care Physician: Marin Olp, MD   Past Medical History:  Diagnosis Date   ADHD    Allergy    Anxiety    Asthma    only before sports   Depression    Factor 5 Leiden mutation, heterozygous [facility])     Past Surgical History:  Procedure Laterality Date   none    None  Current Outpatient Medications  Medication Sig Dispense Refill   busPIRone (BUSPAR) 10 MG tablet Take 1 tablet (10 mg total) by mouth 2 (two) times daily. 180 tablet 3   escitalopram (LEXAPRO) 10 MG tablet Take 1.5 tablets (15 mg total) by mouth daily. 135 tablet 5   hydrOXYzine (ATARAX) 25 MG tablet TAKE 1/2 TO 1 TABLET(12.5 TO 25 MG) BY MOUTH AT BEDTIME AS NEEDED 30 tablet 5   losartan (COZAAR) 50 MG tablet Take 1 tablet (50 mg total) by mouth daily. 90 tablet 3   methylphenidate (RITALIN) 10 MG tablet Take 1 tablet (10 mg total) by mouth 3 (three) times daily with meals. May fill today 90 tablet 0   methylphenidate (RITALIN) 10 MG tablet Take 1 tablet (10 mg total) by mouth 3 (three) times daily with meals. Fill in 2 months 90 tablet 0   methylphenidate (RITALIN) 10 MG tablet Take 1 tablet (10 mg total) by mouth 3 (three) times daily with meals.  Fill in 1 month 90 tablet 0   No current facility-administered medications for this visit.    Allergies  Allergen Reactions   Penicillins     Social History   Socioeconomic History   Marital status: Single    Spouse name: Not on file   Number of children: Not on file   Years of education: Not on file   Highest education level: Not on file  Occupational History   Occupation: financial advisor  Tobacco Use   Smoking status: Never   Smokeless tobacco: Never  Substance and Sexual Activity   Alcohol use: Yes    Alcohol/week: 14.0 standard drinks of alcohol    Types: 14 Standard drinks or equivalent per week    Comment: Daily alcohol   Drug use: No   Sexual activity: Not on file  Other Topics Concern   Not on file  Social History Narrative   ** Merged History Encounter **       Single. Not dating.   Full time at [employer] for business.  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, avid Scientist, clinical (histocompatibility and immunogenetics), wants to play lacrosse again   Social Determinants of Radio broadcast assistant Strain:  Not on file  Food Insecurity: Not on file  Transportation Needs: Not on file  Physical Activity: Not on file  Stress: Not on file  Social Connections: Not on file  Intimate Partner Violence: Not on file    Family History  Problem Relation Age of Onset   Hypertension Mother        Has CAD   CAD Mother    Hypertension Father    Alcoholism Father    Alcoholism Paternal Grandfather     Review of Systems:  As stated in the HPI and otherwise negative.   BP (!) 178/120   Pulse (!) 118   Ht 5' 10"$  (1.778 m)   Wt 95.1 kg   SpO2 98%   BMI 30.07 kg/m   Physical Examination: General: Well developed, well nourished, NAD  HEENT: OP clear, mucus membranes moist  SKIN: warm, dry. No rashes. Neuro: No focal deficits  Musculoskeletal: Muscle strength 5/5 all ext  Psychiatric: Mood and affect normal  Neck: No JVD, no  carotid bruits, no thyromegaly, no lymphadenopathy.  Lungs:Clear bilaterally, no wheezes, rhonci, crackles Cardiovascular: Regular rate and rhythm. No murmurs, gallops or rubs. Abdomen:Soft. Bowel sounds present. Non-tender.  Extremities: No lower extremity edema. Pulses are 2 + in the bilateral DP/PT.  EKG:  EKG is not ordered today. The ekg ordered today demonstrates   Echo 11/11/22:  1. Left ventricular ejection fraction, by estimation, is 50 to 55%. Left  ventricular ejection fraction by 3D volume is 51 %. The left ventricle has  low normal function. The left ventricle has no regional wall motion  abnormalities. There is moderate left   ventricular hypertrophy. Left ventricular diastolic parameters are  consistent with Grade I diastolic dysfunction (impaired relaxation). The  average left ventricular global longitudinal strain is -12.7 %. The global  longitudinal strain is abnormal.   2. Right ventricular systolic function is normal. The right ventricular  size is mildly enlarged. There is normal pulmonary artery systolic  pressure. The estimated right ventricular systolic pressure is Q000111Q mmHg.   3. The mitral valve is abnormal. Trivial mitral valve regurgitation.   4. The aortic valve is tricuspid. Aortic valve regurgitation is not  visualized.   5. Aortic dilatation noted. There is borderline dilatation of the  ascending aorta, measuring 39 mm.   6. The inferior vena cava is normal in size with greater than 50%  respiratory variability, suggesting right atrial pressure of 3 mmHg.   Recent Labs: 10/22/2022: BUN 14; Creatinine, Ser 1.07; Potassium 4.1; Sodium 140   Lipid Panel    Component Value Date/Time   CHOL 254 (H) 08/05/2021 1653   TRIG 148.0 08/05/2021 1653   HDL 52.80 08/05/2021 1653   CHOLHDL 5 08/05/2021 1653   VLDL 29.6 08/05/2021 1653   LDLCALC 172 (H) 08/05/2021 1653     Wt Readings from Last 3 Encounters:  11/19/22 95.1 kg  10/22/22 94.6 kg  02/03/22 91.4  kg    Assessment and Plan:   1. HTN: BP remains elevated. He is on Losartan 25 mg daily. Echo with moderate LVH, normal LV function. Will increase Losartan to 50 mg daily. Exclude renal artery stenosis on planned CTA chest/abd/pelvis. (Adding abdomen/pelvis to this scan to exclude RAS). He has follow up in the PharmD/HTN  clinic next month.   2. Possible dilation of the ascending aorta: Will arrange chest CTA to evaluate.   Labs/ tests ordered today include:   Orders Placed This Encounter  Procedures   CT ANGIO  ABDOMEN PELVIS  W &/OR WO CONTRAST   Disposition:   F/U with me in six months  Signed, Lauree Chandler, MD, [facility] 11/19/2022 4:34 PM    [facility] [address], [place], [place]  [address] Phone: [phone]; Fax: [phone]

## 2022-11-19 NOTE — Patient Instructions (Signed)
Medication Instructions:  Your physician has recommended you make the following change in your medication:  1.) increase losartan to 50 mg daily  *If you need a refill on your cardiac medications before your next appointment, please call your pharmacy*   Lab Work: none   Testing/Procedures: CT chest/abd/pelvis   Follow-Up: At [facility], you and your health needs are our priority.  As part of our continuing mission to provide you with exceptional heart care, we have created designated Provider Care Teams.  These Care Teams include your primary Cardiologist (physician) and Advanced Practice Providers (APPs -  Physician Assistants and Nurse Practitioners) who all work together to provide you with the care you need, when you need it.   Your next appointment:   12 month(s)  Provider:   Lauree Chandler, MD

## 2022-11-25 DIAGNOSIS — F4323 Adjustment disorder with mixed anxiety and depressed mood: Secondary | ICD-10-CM | POA: Diagnosis not present

## 2022-11-27 ENCOUNTER — Other Ambulatory Visit: Payer: Self-pay | Admitting: Family Medicine

## 2022-11-27 ENCOUNTER — Encounter: Payer: Self-pay | Admitting: Family Medicine

## 2022-11-27 NOTE — Telephone Encounter (Signed)
  LAST APPOINTMENT DATE:  02/03/22  NEXT APPOINTMENT DATE: 12/05/22  MEDICATION: methylphenidate (RITALIN) 10 MG tablet   Is the patient out of medication? Has 5 days worth left  PHARMACY:[id number] [place], [place] [facility] [place] [address] [place] [address] Phone: [phone]  Fax: [phone]

## 2022-11-27 NOTE — Telephone Encounter (Signed)
I sent him a MyChart message-Benjamin [name]-I have noted your cardiology evaluation where blood pressure has been extremely high and also starting medication and concern for a possible aneurysm with upcoming CT angiogram-in light of this I do not recommend continuing ADD medications which can increase blood pressure until we make sure there is not an aneurysm  I know this is tough but I hope you understand Benjamin [name]

## 2022-11-30 ENCOUNTER — Encounter: Payer: Self-pay | Admitting: Cardiovascular Disease

## 2022-12-03 ENCOUNTER — Ambulatory Visit (HOSPITAL_COMMUNITY)
Admission: RE | Admit: 2022-12-03 | Discharge: 2022-12-03 | Disposition: A | Discharge status: Home / Self Care | Payer: BC Managed Care – PPO | Source: Ambulatory Visit | Attending: Cardiovascular Disease | Admitting: Cardiovascular Disease

## 2022-12-03 DIAGNOSIS — I77810 Thoracic aortic ectasia: Secondary | ICD-10-CM | POA: Insufficient documentation

## 2022-12-03 DIAGNOSIS — I10 Essential (primary) hypertension: Secondary | ICD-10-CM

## 2022-12-03 DIAGNOSIS — K429 Umbilical hernia without obstruction or gangrene: Secondary | ICD-10-CM | POA: Diagnosis not present

## 2022-12-03 MED ORDER — IOHEXOL 350 MG/ML SOLN
75.0000 mL | Freq: Once | INTRAVENOUS | Status: AC | PRN
  Administered 2022-12-03: 75 mL via INTRAVENOUS

## 2022-12-05 ENCOUNTER — Ambulatory Visit: Payer: BC Managed Care – PPO | Admitting: Family Medicine

## 2022-12-05 ENCOUNTER — Encounter: Payer: Self-pay | Admitting: Family Medicine

## 2022-12-05 VITALS — BP 140/120 | HR 90 | Temp 98.0°F | Ht 70.0 in | Wt 208.0 lb

## 2022-12-05 DIAGNOSIS — I10 Essential (primary) hypertension: Secondary | ICD-10-CM | POA: Diagnosis not present

## 2022-12-05 DIAGNOSIS — F988 Other specified behavioral and emotional disorders with onset usually occurring in childhood and adolescence: Secondary | ICD-10-CM

## 2022-12-05 DIAGNOSIS — E785 Hyperlipidemia, unspecified: Secondary | ICD-10-CM | POA: Insufficient documentation

## 2022-12-05 MED ORDER — ESCITALOPRAM OXALATE 10 MG PO TABS: 10.0000 mg | ORAL_TABLET | Freq: Every day | ORAL | 3 refills | Status: DC

## 2022-12-05 MED ORDER — LOSARTAN POTASSIUM-HCTZ 100-25 MG PO TABS: 1.0000 | ORAL_TABLET | Freq: Every day | ORAL | 3 refills | Status: DC

## 2022-12-05 NOTE — Patient Instructions (Signed)
Losartan-hydrochlorothiazide 100-25 mg- take a half tablet for 2 days and if doing well can take full tablet- I'm hoping for substantial improvement by next week  I fully understand your concern about trying new meds on ADD front- if we can get pressure down and keep it down while on blood pressure medicines id be willing to retrial the ritalin as long as pressure remains controlled- lets stay in very close contact - message me a week after pharmacy visit OR if controlled at pharmacy visit reach out and we can discuss

## 2022-12-05 NOTE — Assessment & Plan Note (Signed)
#  Hyperlipidemia - we discussed LDL near 180 and with family history considering statin- we wanted to get BP controlled first and work on lfiestyle then readdress Lab Results  Component Value Date   CHOL 254 (H) 08/05/2021   HDL 52.80 08/05/2021   LDLCALC 172 (H) 08/05/2021   TRIG 148.0 08/05/2021   CHOLHDL 5 08/05/2021

## 2022-12-05 NOTE — Progress Notes (Signed)
Phone [phone] In person visit   Subjective:   Benjamin [name] is a 32 y.o. year old very pleasant male patient who presents for/with See problem oriented charting Chief Complaint  Patient presents with   Follow-up    Pt is here to f/u on ADD meds due to heart condition.    Past Medical History-  Patient Active Problem List   Diagnosis Date Noted   Ulcerative colitis ([place]) 04/07/2019    Priority: High   Factor V Leiden mutation ([place]) 11/28/2016    Priority: High   Attention deficit disorder (ADD) in adult 05/25/2012    Priority: High   Hyperlipemia 12/05/2022    Priority: Medium    White coat syndrome with diagnosis of hypertension 07/31/2018    Priority: Medium    Asthma     Priority: Medium    Generalized anxiety disorder 02/20/2013    Priority: Medium    Somatic dysfunction of spine, cervical 04/30/2021    Priority: 1.   Chronic neck pain 01/16/2021    Priority: 1.    Medications- reviewed and updated Current Outpatient Medications  Medication Sig Dispense Refill   busPIRone (BUSPAR) 10 MG tablet Take 1 tablet (10 mg total) by mouth 2 (two) times daily. 180 tablet 3   hydrOXYzine (ATARAX) 25 MG tablet TAKE 1/2 TO 1 TABLET(12.5 TO 25 MG) BY MOUTH AT BEDTIME AS NEEDED 30 tablet 5   losartan-hydrochlorothiazide (HYZAAR) 100-25 MG tablet Take 1 tablet by mouth daily. 90 tablet 3   escitalopram (LEXAPRO) 10 MG tablet Take 1 tablet (10 mg total) by mouth daily. 90 tablet 3   methylphenidate (RITALIN) 10 MG tablet Take 1 tablet (10 mg total) by mouth 3 (three) times daily with meals. May fill today 90 tablet 0   methylphenidate (RITALIN) 10 MG tablet Take 1 tablet (10 mg total) by mouth 3 (three) times daily with meals. Fill in 2 months 90 tablet 0   methylphenidate (RITALIN) 10 MG tablet Take 1 tablet (10 mg total) by mouth 3 (three) times daily with meals. Fill in 1 month 90 tablet 0   No current facility-administered medications for this visit.      Objective:  BP (!) 140/120   Pulse 90   Temp 98 F (36.7 C)   Ht '5\' 10"'$  (1.778 m)   Wt 208 lb (94.3 kg)   SpO2 99%   BMI 29.84 kg/m  Gen: NAD, resting comfortably     Assessment and Plan   #White Coat Syndrome but also now with hypertension S: In 2020 discovered patient was taking intermittent decongestants and regular Afrin-we recommended discontinuation.  He declined ENT when initially discovered but later seen- Nasal surgery planned- blood pressure was 210 and they did not get procedure done- got referred to Dr. Angelena Form to bring home pressure for his opinon  ENded up with echocardiogram and then later concern for aortic dilation so had CTA cap- this also ruled out renal artery stenosis and no adernal adenomas noted - to evaluate for secondary causes of hyerptension  Home readings- on losartan 50 mg reports before meds up to 190s at home. With med down to 150/100- has forgotten tot take blood pressure readings this week but is taking medicine consistnetly   Has restarted afrin in last month which can raise blood pressure Playing tennis 3x a week Ritalin on hold in last week with elevated blood pressures BP Readings from Last 3 Encounters:  12/05/22 (!) 140/120  11/19/22 (!) 178/120  10/22/22 (!) 220/140  A/P: Hypertension- poorly controlled- opted to start "Losartan-hydrochlorothiazide 100-25 mg- take a half tablet for 2 days and if doing well can take full tablet- I'm hoping for substantial improvement by next week" when he sees cardiology pharmacy clinic. I want him to touch base with me after that -strongly advised no afrin  #Adult ADD S: Compliant with Ritalin 10 mg twice dail in the past- Off meds for a week and having significant focus issues including forgetting to take his blood prssure. Patient anxious about being off meds and is struggling overall with work/life balance. . Depression with extended release version in the past and states was very challenging when first  getting startec on meds with finding right meds. He does not want to try alternate treatments at this time.   -Diagnosed 2011 by Camillia Herter, MS, [facility] not reviewed today as not starting meds at present and did not review UDS - Controlled substance contract March 26, 2018  A/P: Poor control- patient with anxiety about starting antihypertensive like Intuniv- he preferred to see if we could get blood pressure controlled then retrial ritalin-  I think that's reasonable.    #Generalized anxiety disorder S: Patient remains on escitalopram 10 mg, buspirone 10 mg BID, hydroxyzine at night.  diagnosed in high school-admits several other failures..  Felt mentally slowed on 20 mg Lexapro. A/P: anxiety reportedly worse off add meds as forgetting things/worse focus/more fatigue- he wants to hold steady on meds and try to get blood pressure controlled- I think that's reasonable and suspect will improve as we stabilize blood pressure and ADD  #Hyperlipidemia - we discussed LDL near 180 and with family history considering statin- we wanted to get BP controlled first and work on lfiestyle then readdress Lab Results  Component Value Date   CHOL 254 (H) 08/05/2021   HDL 52.80 08/05/2021   LDLCALC 172 (H) 08/05/2021   TRIG 148.0 08/05/2021   CHOLHDL 5 08/05/2021   Recommended follow up: lets stay in very close contact - message me a week after pharmacy visit OR if controlled at pharmacy visit reach out and we can discuss Future Appointments  Date Time Provider [place]  12/12/2022  2:30 PM [facility]   Lab/Order associations:   ICD-10-CM   1. White coat syndrome with diagnosis of hypertension  I10     2. Attention deficit disorder (ADD) in adult  F98.8     3. Hyperlipidemia, unspecified hyperlipidemia type  E78.5       Meds ordered this encounter  Medications   losartan-hydrochlorothiazide (HYZAAR) 100-25 MG tablet     Sig: Take 1 tablet by mouth daily.    Dispense:  90 tablet    Refill:  3   escitalopram (LEXAPRO) 10 MG tablet    Sig: Take 1 tablet (10 mg total) by mouth daily.    Dispense:  90 tablet    Refill:  3    Return precautions advised.  Garret Reddish, MD

## 2022-12-11 ENCOUNTER — Telehealth: Payer: Self-pay | Admitting: Family Medicine

## 2022-12-11 NOTE — Telephone Encounter (Signed)
Patient states: - Started losartan/HCTZ on 12/06/22 and started experiencing neck soreness on 3/3 - Soreness is still present, not bad and hasn't worsened  Please Advise.

## 2022-12-12 ENCOUNTER — Ambulatory Visit: Payer: BC Managed Care – PPO | Attending: Internal Medicine | Admitting: Pharmacist

## 2022-12-12 ENCOUNTER — Encounter: Payer: Self-pay | Admitting: Pharmacist

## 2022-12-12 VITALS — BP 120/88 | HR 98

## 2022-12-12 DIAGNOSIS — I10 Essential (primary) hypertension: Secondary | ICD-10-CM | POA: Diagnosis not present

## 2022-12-12 MED ORDER — IRBESARTAN 300 MG PO TABS: 300.0000 mg | ORAL_TABLET | Freq: Every day | ORAL | 3 refills | Status: DC

## 2022-12-12 MED ORDER — CHLORTHALIDONE 25 MG PO TABS: 25.0000 mg | ORAL_TABLET | Freq: Every day | ORAL | 3 refills | Status: DC

## 2022-12-12 NOTE — Telephone Encounter (Signed)
If its mild - then I think monitoring is ok- try heat 15 minutes 3 x a day. Also stay hydrated in case related to cramping from the hydrochlorothiazide (diuretic).

## 2022-12-12 NOTE — Telephone Encounter (Signed)
FYI

## 2022-12-12 NOTE — Telephone Encounter (Signed)
Pt returned call and below message given.

## 2022-12-12 NOTE — Telephone Encounter (Signed)
Called and lm on pt vm tcb. 

## 2022-12-12 NOTE — Progress Notes (Signed)
Patient ID: Benjamin [name]                 DOB: 1991-05-26                      MRN: [id number]     HPI: Benjamin [name] is a 32 y.o. male referred by Dr. Angelena Form to HTN clinic. PMH is significant for HTN, anxiety, depression, asthma, ADHD, and Factor 5 Leiden deficiency. First seen by Dr Angelena Form 10/22/22 for evaluation of HTN. SBP had been 210 on 10/21/22 at surgery center for sinus surgery. Pt reported white coat HTN. Was started on losartan '25mg'$  daily at that time. Echo 11/11/22 with EF 50-55%. BP remained elevated at 178/120 with HR 118 at f/u visit on 11/19/22. Home SBP 170-180 range. His losartan was increased to '50mg'$  daily and he was referred to PharmD for follow up. CT angio 12/03/22 showed no evidence of dilation of the ascending aorta or any abnormalities of the thoracic or abdominal aorta, no evidence of renal artery stenosis. Saw his PCP on 12/05/22 where BP was 140/120 and he was changed to losartan-HCTZ 100-'25mg'$  to start 1/2 tab daily x 2 days then increase to full tab.  Pt denies dizziness, headache, or LE edema. Asymptomatic when his BP is high. Has been off Ritalin for the past 1.5 weeks or so while he's been trying to get his BP under control. Started losartan-HCTZ combo pill on Saturday. Reports lymph node pain started on Sunday and became more fatigued on Monday.   Has been drinking 1 energy drink each day (equivalent to 3 cups of coffee). Trying to watch his salt intake. Stays active. Chronic Afrin user. Has chronic neck pain, was taking '600mg'$  ibuprofen twice daily 1-2 months ago. Lowest BP reading he had seen at home was 150/120 range.  Current HTN meds: losartan-HCTZ 100-'25mg'$  daily - taking in the AM  BP goal: closer to 120/80 given young age  Family History: Mother with HTN and CAD, father with HTN and alcoholism, paternal grandfather with alcoholism.  Social History: Daily alcohol use.  Diet: watches his salt  Exercise: tennis 3 days a week  Wt Readings from  Last 3 Encounters:  12/05/22 208 lb (94.3 kg)  11/19/22 209 lb 9.6 oz (95.1 kg)  10/22/22 208 lb 9.6 oz (94.6 kg)   BP Readings from Last 3 Encounters:  12/05/22 (!) 140/120  11/19/22 (!) 178/120  10/22/22 (!) 220/140   Pulse Readings from Last 3 Encounters:  12/05/22 90  11/19/22 (!) 118  10/22/22 87    Renal function: CrCl cannot be calculated (Patient's most recent lab result is older than the maximum 21 days allowed.).  Past Medical History:  Diagnosis Date   ADHD    Allergy    Anxiety    Asthma    only before sports   Depression    Factor 5 Leiden mutation, heterozygous [facility])     Current Outpatient Medications on File Prior to Visit  Medication Sig Dispense Refill   busPIRone (BUSPAR) 10 MG tablet Take 1 tablet (10 mg total) by mouth 2 (two) times daily. 180 tablet 3   escitalopram (LEXAPRO) 10 MG tablet Take 1 tablet (10 mg total) by mouth daily. 90 tablet 3   hydrOXYzine (ATARAX) 25 MG tablet TAKE 1/2 TO 1 TABLET(12.5 TO 25 MG) BY MOUTH AT BEDTIME AS NEEDED 30 tablet 5   losartan-hydrochlorothiazide (HYZAAR) 100-25 MG tablet Take 1 tablet by mouth daily. 90 tablet 3  methylphenidate (RITALIN) 10 MG tablet Take 1 tablet (10 mg total) by mouth 3 (three) times daily with meals. May fill today 90 tablet 0   methylphenidate (RITALIN) 10 MG tablet Take 1 tablet (10 mg total) by mouth 3 (three) times daily with meals. Fill in 2 months 90 tablet 0   methylphenidate (RITALIN) 10 MG tablet Take 1 tablet (10 mg total) by mouth 3 (three) times daily with meals. Fill in 1 month 90 tablet 0   No current facility-administered medications on file prior to visit.    Allergies  Allergen Reactions   Penicillins      Assessment/Plan:  1. Hypertension - BP much improved and closer to goal 120/80, however pt reporting fatigue and pain in his lymph nodes since changing from losartan '50mg'$  to losartan-HCTZ 100-'25mg'$  daily. Discussed that lymph node pain should not be coming from his  med change and that fatigue may be multifactorial - he stopped his Ritalin about a week and a half ago which is likely contributing, and may also be experiencing fatigue from notable drop in his BP. Discussed that ideally fatigue should improve as his body readjusts to lower BP readings. Also discussed limiting his Afrin and ibuprofen use which may be contributing to his HTN. I think it's ok to retry his Ritalin given notable improvement in BP today. No aneurysm noted on CT. He's been drinking energy drinks to help combat fatigue which maybe impacting BP as well. Discussed continuing current meds to see if his fatigue improves vs trialing different ARB and thiazide, pt prefers to change meds. Will replace losartan-HCTZ 100-'25mg'$  daily with irbesartan '300mg'$  daily and chlorthalidone '25mg'$  daily. Checking BMET today with recent addition of thiazide. Repeat BP check in 2 weeks. Pt advised to monitor BP at home and bring in log of readings and cuff to his next appt. Can add amlodipine at that time if needed.  Sahvanna Mcmanigal E. Burma Ketcher, PharmD, BCACP, [place]. [address], [place], [place] [address] Phone: [phone]; Fax: [phone] 12/12/2022 3:09 PM

## 2022-12-12 NOTE — Patient Instructions (Addendum)
Your blood pressure goal is 120/80  We'll replace losartan-HCTZ with irbesartan and chlorthalidone, 1 tablet daily for each medication  Monitor your blood pressure at home  Limit afrin and ibuprofen use - both can increase your blood pressure  Try to limit energy drinks  Follow up in 2 weeks for repeat blood pressure check

## 2022-12-13 LAB — BASIC METABOLIC PANEL
BUN/Creatinine Ratio: 12 (ref 9–20)
BUN: 15 mg/dL (ref 6–20)
CO2: 26 mmol/L (ref 20–29)
Calcium: 9.8 mg/dL (ref 8.7–10.2)
Chloride: 97 mmol/L (ref 96–106)
Creatinine, Ser: 1.23 mg/dL (ref 0.76–1.27)
Glucose: 103 mg/dL — ABNORMAL HIGH (ref 70–99)
Potassium: 3.7 mmol/L (ref 3.5–5.2)
Sodium: 139 mmol/L (ref 134–144)
eGFR: 81 mL/min/{1.73_m2} (ref >59)

## 2022-12-15 ENCOUNTER — Telehealth: Payer: Self-pay | Admitting: Pharmacist

## 2022-12-15 MED ORDER — METHYLPHENIDATE HCL 10 MG PO TABS: 10.0000 mg | ORAL_TABLET | Freq: Two times a day (BID) | ORAL | 0 refills | Status: DC

## 2022-12-15 NOTE — Telephone Encounter (Signed)
Baseline BMET on 10/22/22 showed SCr at 1.07. At that visit, he was started on losartan '25mg'$  daily. Dose increased to '50mg'$  daily at 11/19/22 visit. 12/05/22 changed to losartan-HCTZ 100-'25mg'$  daily. Repeat BMET checked with me at 12/12/22 visit, SCr up 13% to 1.23. Acceptable increase based on recent med changes. I changed pt to irbesartan '300mg'$  daily and chlorthalidone '25mg'$  daily on 3/8 visit.  No med changes based on these results, encouraged pt to stay hydrated and avoid NSAIDs and will recheck BMET at next appt with me in 2 weeks. He was appreciative for the call.

## 2022-12-23 DIAGNOSIS — F4323 Adjustment disorder with mixed anxiety and depressed mood: Secondary | ICD-10-CM | POA: Diagnosis not present

## 2022-12-25 DIAGNOSIS — M6281 Muscle weakness (generalized): Secondary | ICD-10-CM | POA: Diagnosis not present

## 2022-12-25 DIAGNOSIS — M542 Cervicalgia: Secondary | ICD-10-CM | POA: Diagnosis not present

## 2022-12-26 ENCOUNTER — Ambulatory Visit: Payer: BC Managed Care – PPO

## 2022-12-26 NOTE — Progress Notes (Deleted)
Patient ID: Benjamin [name]                 DOB: [birth year]                      MRN: [id number]     HPI: Benjamin [name] is a 32 y.o. male referred by Dr. Angelena Form to HTN clinic. PMH is significant for HTN, anxiety, depression, asthma, ADHD, and Factor 5 Leiden deficiency. First seen by Dr Angelena Form 10/22/22 for evaluation of HTN. SBP had been 210 on 10/21/22 at surgery center for sinus surgery. Pt reported white coat HTN. Was started on losartan 25mg  daily at that time. Echo 11/11/22 with EF 50-55%. BP remained elevated at 178/120 with HR 118 at f/u visit on 11/19/22. Home SBP 170-180 range. His losartan was increased to 50mg  daily and he was referred to PharmD for follow up. CT angio 12/03/22 showed no evidence of dilation of the ascending aorta or any abnormalities of the thoracic or abdominal aorta, no evidence of renal artery stenosis. Saw his PCP on 12/05/22 where BP was 140/120 and he was changed to losartan-HCTZ 100-25mg  to start 1/2 tab daily x 2 days then increase to full tab.  I saw pt on 12/12/22. He was reporting fatigue and lymph node pain. Combo pill was changed to irbesartan and chlorthalidone, advised should be ok to resume Ritalin (had been holding while trying to bring BP under better control). Encouraged to decrease energy drinks, Afrin, and ibuprofen use. BMET showed 13% increase in SCr to 1.25, pt encouraged to stay hydrated and will recheck labs today.  Headache and lymph node pain better? Afrin, nsaid, energy drink use? Back on ritalin? Home bp? Check bmet today Start amlo if needed   Has been drinking 1 energy drink each day (equivalent to 3 cups of coffee). Trying to watch his salt intake. Stays active. Chronic Afrin user. Has chronic neck pain, was taking 600mg  ibuprofen twice daily 1-2 months ago. Lowest BP reading he had seen at home was 150/120 range.  Current HTN meds: irbesartan 300mg  daily, chlorthalidone 25mg  daily  BP goal: closer to 120/80 given young  age  Family History: Mother with HTN and CAD, father with HTN and alcoholism, paternal grandfather with alcoholism.  Social History: Daily alcohol use.  Diet: watches his salt  Exercise: tennis 3 days a week  Wt Readings from Last 3 Encounters:  12/05/22 208 lb (94.3 kg)  11/19/22 209 lb 9.6 oz (95.1 kg)  10/22/22 208 lb 9.6 oz (94.6 kg)   BP Readings from Last 3 Encounters:  12/12/22 120/88  12/05/22 (!) 140/120  11/19/22 (!) 178/120   Pulse Readings from Last 3 Encounters:  12/12/22 98  12/05/22 90  11/19/22 (!) 118    Renal function: CrCl cannot be calculated (Unknown ideal weight.).  Past Medical History:  Diagnosis Date   ADHD    Allergy    Anxiety    Asthma    only before sports   Depression    Factor 5 Leiden mutation, heterozygous [facility])     Current Outpatient Medications on File Prior to Visit  Medication Sig Dispense Refill   busPIRone (BUSPAR) 10 MG tablet Take 1 tablet (10 mg total) by mouth 2 (two) times daily. 180 tablet 3   chlorthalidone (HYGROTON) 25 MG tablet Take 1 tablet (25 mg total) by mouth daily. 90 tablet 3   escitalopram (LEXAPRO) 10 MG tablet Take 1 tablet (10 mg total) by mouth daily. [place]  tablet 3   hydrOXYzine (ATARAX) 25 MG tablet TAKE 1/2 TO 1 TABLET(12.5 TO 25 MG) BY MOUTH AT BEDTIME AS NEEDED 30 tablet 5   irbesartan (AVAPRO) 300 MG tablet Take 1 tablet (300 mg total) by mouth daily. 90 tablet 3   methylphenidate (RITALIN) 10 MG tablet Take 1 tablet (10 mg total) by mouth 3 (three) times daily with meals. Fill in 2 months 90 tablet 0   methylphenidate (RITALIN) 10 MG tablet Take 1 tablet (10 mg total) by mouth 2 (two) times daily. 90 tablet 0   No current facility-administered medications on file prior to visit.    Allergies  Allergen Reactions   Penicillins      Assessment/Plan:  1. Hypertension - BP much improved and closer to goal 120/80, however pt reporting fatigue and pain in his lymph nodes since changing from losartan  50mg  to losartan-HCTZ 100-25mg  daily. Discussed that lymph node pain should not be coming from his med change and that fatigue may be multifactorial - he stopped his Ritalin about a week and a half ago which is likely contributing, and may also be experiencing fatigue from notable drop in his BP. Discussed that ideally fatigue should improve as his body readjusts to lower BP readings. Also discussed limiting his Afrin and ibuprofen use which may be contributing to his HTN. I think it's ok to retry his Ritalin given notable improvement in BP today. No aneurysm noted on CT. He's been drinking energy drinks to help combat fatigue which maybe impacting BP as well. Discussed continuing current meds to see if his fatigue improves vs trialing different ARB and thiazide, pt prefers to change meds. Will replace losartan-HCTZ 100-25mg  daily with irbesartan 300mg  daily and chlorthalidone 25mg  daily. Checking BMET today with recent addition of thiazide. Repeat BP check in 2 weeks. Pt advised to monitor BP at home and bring in log of readings and cuff to his next appt. Can add amlodipine at that time if needed.  Sevana Grandinetti E. Rutledge Selsor, PharmD, BCACP, [place]. [address], [place], [place] [address] Phone: [phone]; Fax: [phone] 12/26/2022 7:12 AM

## 2023-01-02 ENCOUNTER — Other Ambulatory Visit: Payer: Self-pay | Admitting: Family Medicine

## 2023-01-08 DIAGNOSIS — M542 Cervicalgia: Secondary | ICD-10-CM | POA: Diagnosis not present

## 2023-01-08 DIAGNOSIS — M6281 Muscle weakness (generalized): Secondary | ICD-10-CM | POA: Diagnosis not present

## 2023-01-15 DIAGNOSIS — F4323 Adjustment disorder with mixed anxiety and depressed mood: Secondary | ICD-10-CM | POA: Diagnosis not present

## 2023-01-26 ENCOUNTER — Encounter: Payer: Self-pay | Admitting: Family Medicine

## 2023-01-27 MED ORDER — METHYLPHENIDATE HCL 10 MG PO TABS: 10.0000 mg | ORAL_TABLET | Freq: Three times a day (TID) | ORAL | 0 refills | Status: DC

## 2023-01-28 ENCOUNTER — Other Ambulatory Visit: Payer: Self-pay | Admitting: Family Medicine

## 2023-01-28 MED ORDER — METHYLPHENIDATE HCL 10 MG PO TABS: 10.0000 mg | ORAL_TABLET | Freq: Three times a day (TID) | ORAL | 0 refills | Status: DC

## 2023-01-28 NOTE — Telephone Encounter (Signed)
Also, he is completely out. Need asap.

## 2023-01-28 NOTE — Telephone Encounter (Signed)
Patient requests to be called or sent a MyChart message for status of request

## 2023-01-28 NOTE — Telephone Encounter (Signed)
Pt states the original pharmacy that the RX  methylphenidate (RITALIN) 10 MG tablet  Was sent to are on backorder, can you please resend to [facility]. Please advise.

## 2023-01-29 DIAGNOSIS — M542 Cervicalgia: Secondary | ICD-10-CM | POA: Diagnosis not present

## 2023-01-29 DIAGNOSIS — M6281 Muscle weakness (generalized): Secondary | ICD-10-CM | POA: Diagnosis not present

## 2023-02-02 ENCOUNTER — Ambulatory Visit: Payer: BC Managed Care – PPO

## 2023-02-02 NOTE — Progress Notes (Unsigned)
Patient ID: Benjamin [name]                 DOB: [birth year]                      MRN: [id number]     HPI: Benjamin [name] is a 32 y.o. male referred by Dr. Clifton James to HTN clinic. PMH is significant for HTN, anxiety, depression, asthma, ADHD, and Factor 5 Leiden deficiency.   First seen by Dr Clifton James 10/22/22 for evaluation of HTN. SBP had been 210 on 10/21/22 at surgery center for sinus surgery. Pt reported white coat HTN. Was started on losartan 25mg  daily at that time. Echo 11/11/22 with EF 50-55%. BP remained elevated at 178/120 with HR 118 at f/u visit on 11/19/22. Home SBP 170-180 range. His losartan was increased to 50mg  daily and he was referred to PharmD for follow up. CT angio 12/03/22 showed no evidence of dilation of the ascending aorta or any abnormalities of the thoracic or abdominal aorta, no evidence of renal artery stenosis. Saw his PCP on 12/05/22 where BP was 140/120 and he was changed to losartan-HCTZ 100-25mg .  At last visit with HTN clinic on 12/12/2022, patient's BP was 120/80 mmHg. However, patient reported fatigue and pain in his lymph nodes after starting losartan-HCTZ. Patient reported temporarily stopping Ritalin, chronic use of Afrin and ibuprofen, and drinking 1 energy drink per day. Losartan-HCTZ was switched to irbesartan 300 mg daily and chlorthalidone 25 mg daily. Patient was counseled on monitoring BP readings at home and bringing them into future visits.  Current HTN meds: Irbesartan 300 mg daily, chlorthalidone 25 mg daily Previously tried: Losartan-HCTZ BP goal: < 120/80 mmHg given age  Family History: Mother - HTN, CAD; father - HTN, alcoholism; paternal grandfather - alcoholism.  Social History: Daily alcohol use.  Diet: Watches salt intake.  Exercise: Plays tennis 3 days per week.  Home BP readings:   Wt Readings from Last 3 Encounters:  12/05/22 208 lb (94.3 kg)  11/19/22 209 lb 9.6 oz (95.1 kg)  10/22/22 208 lb 9.6 oz (94.6 kg)   BP  Readings from Last 3 Encounters:  12/12/22 120/88  12/05/22 (!) 140/120  11/19/22 (!) 178/120   Pulse Readings from Last 3 Encounters:  12/12/22 98  12/05/22 90  11/19/22 (!) 118    Renal function: CrCl cannot be calculated (Patient's most recent lab result is older than the maximum 21 days allowed.).  Past Medical History:  Diagnosis Date   ADHD    Allergy    Anxiety    Asthma    only before sports   Depression    Factor 5 Leiden mutation, heterozygous [facility])     Current Outpatient Medications on File Prior to Visit  Medication Sig Dispense Refill   busPIRone (BUSPAR) 10 MG tablet Take 1 tablet (10 mg total) by mouth 2 (two) times daily. 180 tablet 3   chlorthalidone (HYGROTON) 25 MG tablet Take 1 tablet (25 mg total) by mouth daily. 90 tablet 3   escitalopram (LEXAPRO) 10 MG tablet TAKE 1 AND 1/2 TABLETS(15 MG) BY MOUTH DAILY 135 tablet 0   hydrOXYzine (ATARAX) 25 MG tablet TAKE 1/2 TO 1 TABLET(12.5 TO 25 MG) BY MOUTH AT BEDTIME AS NEEDED 30 tablet 5   irbesartan (AVAPRO) 300 MG tablet Take 1 tablet (300 mg total) by mouth daily. 90 tablet 3   methylphenidate (RITALIN) 10 MG tablet Take 1 tablet (10 mg total) by mouth 3 (three) times  daily with meals. 90 tablet 0   methylphenidate (RITALIN) 10 MG tablet Take 1 tablet (10 mg total) by mouth 3 (three) times daily with meals. Fill in 1 month 90 tablet 0   No current facility-administered medications on file prior to visit.    Allergies  Allergen Reactions   Penicillins      Assessment/Plan:  1. Hypertension -

## 2023-02-03 ENCOUNTER — Telehealth: Payer: Self-pay | Admitting: Family Medicine

## 2023-02-03 MED ORDER — BUSPIRONE HCL 10 MG PO TABS: 10.0000 mg | ORAL_TABLET | Freq: Two times a day (BID) | ORAL | 3 refills | Status: DC

## 2023-02-03 NOTE — Telephone Encounter (Signed)
Rx refilled to requested pharmacy. 

## 2023-02-03 NOTE — Telephone Encounter (Signed)
Prescription Request  02/03/2023  LOV: 12/05/2022  What is the name of the medication or equipment?  busPIRone (BUSPAR) 10 MG tablet   Have you contacted your pharmacy to request a refill? Yes   Which pharmacy would you like this sent to?  [id number] - [place], [place] - [address] AT [facility] [address] [place] [address] Phone: [phone] Fax: [phone]   Patient notified that their request is being sent to the clinical staff for review and that they should receive a response within 2 business days.   Please advise at Mobile [phone] (mobile)

## 2023-02-05 DIAGNOSIS — M6281 Muscle weakness (generalized): Secondary | ICD-10-CM | POA: Diagnosis not present

## 2023-02-05 DIAGNOSIS — M542 Cervicalgia: Secondary | ICD-10-CM | POA: Diagnosis not present

## 2023-02-05 DIAGNOSIS — F4323 Adjustment disorder with mixed anxiety and depressed mood: Secondary | ICD-10-CM | POA: Diagnosis not present

## 2023-02-19 DIAGNOSIS — M6281 Muscle weakness (generalized): Secondary | ICD-10-CM | POA: Diagnosis not present

## 2023-02-19 DIAGNOSIS — M542 Cervicalgia: Secondary | ICD-10-CM | POA: Diagnosis not present

## 2023-02-19 DIAGNOSIS — F4323 Adjustment disorder with mixed anxiety and depressed mood: Secondary | ICD-10-CM | POA: Diagnosis not present

## 2023-02-23 ENCOUNTER — Ambulatory Visit: Payer: BC Managed Care – PPO

## 2023-02-23 NOTE — Progress Notes (Deleted)
Patient ID: Benjamin [name]                 DOB: Jun 13, 1991                      MRN: [id number]      HPI: Benjamin [name] is a 32 y.o. male referred by Dr. Clifton James to HTN clinic. PMH is significant for UC, white coat syndrome with dx of HTN, ADD, Chronic neck pain, GAD, HDL, Factor 5 Leiden deficiency. First seen by Dr Clifton James 10/22/22 for evaluation of HTN. SBP had been 210 on 10/21/22 at surgery center for sinus surgery. Pt reported white coat HTN. Was started on losartan 25mg  daily at that time. Echo 11/11/22 with EF 50-55%. BP remained elevated at 178/120 with HR 118 at f/u visit on 11/19/22. Home SBP 170-180 range. His losartan was increased to 50mg  daily and he was referred to PharmD for follow up. CT angio 12/03/22 showed no evidence of dilation of the ascending aorta or any abnormalities of the thoracic or abdominal aorta, no evidence of renal artery stenosis. Saw his PCP on 12/05/22 where BP was 140/120 and he was changed to losartan-HCTZ 100-25mg  to start 1/2 tab daily x 2 days then increase to full tab. Patient saw Margaretmary Dys, PharmD on 12/12/2022 losartan -HCTZ combo was changed to irbesartan 300 mg and chlorthalidone 25 mg daily as patient thought losartan-HCTZ caused lymph node pain  and fatigue and patient was advised to go for follow up BMP in 2 weeks of med change.     Current HTN meds: chlorthalidone 25 mg daily, irbesartan 300 mg daily  Previously tried: Losartan - HCTZ  BP goal: <120/80   Family History:   Social History:   Diet:   Exercise:  {types:28256}  Home BP readings:  Date SBP/DBP  HR              Average      Wt Readings from Last 3 Encounters:  12/05/22 208 lb (94.3 kg)  11/19/22 209 lb 9.6 oz (95.1 kg)  10/22/22 208 lb 9.6 oz (94.6 kg)   BP Readings from Last 3 Encounters:  12/12/22 120/88  12/05/22 (!) 140/120  11/19/22 (!) 178/120   Pulse Readings from Last 3 Encounters:  12/12/22 98  12/05/22 90  11/19/22 (!) 118    Renal  function: CrCl cannot be calculated (Patient's most recent lab result is older than the maximum 21 days allowed.).  Past Medical History:  Diagnosis Date   ADHD    Allergy    Anxiety    Asthma    only before sports   Depression    Factor 5 Leiden mutation, heterozygous [facility])     Current Outpatient Medications on File Prior to Visit  Medication Sig Dispense Refill   busPIRone (BUSPAR) 10 MG tablet Take 1 tablet (10 mg total) by mouth 2 (two) times daily. 180 tablet 3   chlorthalidone (HYGROTON) 25 MG tablet Take 1 tablet (25 mg total) by mouth daily. 90 tablet 3   escitalopram (LEXAPRO) 10 MG tablet TAKE 1 AND 1/2 TABLETS(15 MG) BY MOUTH DAILY 135 tablet 0   hydrOXYzine (ATARAX) 25 MG tablet TAKE 1/2 TO 1 TABLET(12.5 TO 25 MG) BY MOUTH AT BEDTIME AS NEEDED 30 tablet 5   irbesartan (AVAPRO) 300 MG tablet Take 1 tablet (300 mg total) by mouth daily. 90 tablet 3   methylphenidate (RITALIN) 10 MG tablet Take 1 tablet (10 mg total) by mouth 3 (  three) times daily with meals. 90 tablet 0   methylphenidate (RITALIN) 10 MG tablet Take 1 tablet (10 mg total) by mouth 3 (three) times daily with meals. Fill in 1 month 90 tablet 0   No current facility-administered medications on file prior to visit.    Allergies  Allergen Reactions   Penicillins     There were no vitals taken for this visit.   Assessment/Plan:  1. Hypertension -  No problem-specific Assessment & Plan notes found for this encounter.      Thank you  Carmela Hurt, Pharm.D [facility] [address] [place], [place] [address]  Phone: [phone]; Fax: [phone]

## 2023-03-12 ENCOUNTER — Ambulatory Visit: Payer: BC Managed Care – PPO

## 2023-03-30 ENCOUNTER — Encounter: Payer: Self-pay | Admitting: Family Medicine

## 2023-03-31 ENCOUNTER — Other Ambulatory Visit: Payer: Self-pay

## 2023-03-31 MED ORDER — METHYLPHENIDATE HCL 10 MG PO TABS: 10.0000 mg | ORAL_TABLET | Freq: Three times a day (TID) | ORAL | 0 refills | Status: DC

## 2023-04-01 MED ORDER — METHYLPHENIDATE HCL 10 MG PO TABS: 10.0000 mg | ORAL_TABLET | Freq: Three times a day (TID) | ORAL | 0 refills | Status: DC

## 2023-04-01 NOTE — Progress Notes (Signed)
sent 

## 2023-04-07 DIAGNOSIS — M6281 Muscle weakness (generalized): Secondary | ICD-10-CM | POA: Diagnosis not present

## 2023-04-07 DIAGNOSIS — M542 Cervicalgia: Secondary | ICD-10-CM | POA: Diagnosis not present

## 2023-04-22 ENCOUNTER — Telehealth: Payer: Self-pay | Admitting: Family Medicine

## 2023-04-22 DIAGNOSIS — F4323 Adjustment disorder with mixed anxiety and depressed mood: Secondary | ICD-10-CM | POA: Diagnosis not present

## 2023-04-22 NOTE — Telephone Encounter (Signed)
Prescription Request  04/22/2023  LOV: 12/05/2022  What is the name of the medication or equipment? hydrOXYzine (ATARAX) 25 MG tablet   Have you contacted your pharmacy to request a refill? Yes   Which pharmacy would you like this sent to?   [id number] [place], [place] - [address] [place] [address] Phone: [phone] Fax: [phone]    Patient notified that their request is being sent to the clinical staff for review and that they should receive a response within 2 business days.   Please advise at Mobile [phone] (mobile)

## 2023-04-23 DIAGNOSIS — M6281 Muscle weakness (generalized): Secondary | ICD-10-CM | POA: Diagnosis not present

## 2023-04-23 DIAGNOSIS — M542 Cervicalgia: Secondary | ICD-10-CM | POA: Diagnosis not present

## 2023-04-23 MED ORDER — HYDROXYZINE HCL 25 MG PO TABS: 25.0000 mg | ORAL_TABLET | Freq: Three times a day (TID) | ORAL | 5 refills | Status: DC | PRN

## 2023-04-23 NOTE — Telephone Encounter (Signed)
Refill sent to requested pharmacy.

## 2023-05-07 ENCOUNTER — Ambulatory Visit: Payer: BC Managed Care – PPO

## 2023-05-14 DIAGNOSIS — M6281 Muscle weakness (generalized): Secondary | ICD-10-CM | POA: Diagnosis not present

## 2023-05-14 DIAGNOSIS — M542 Cervicalgia: Secondary | ICD-10-CM | POA: Diagnosis not present

## 2023-06-02 DIAGNOSIS — F4323 Adjustment disorder with mixed anxiety and depressed mood: Secondary | ICD-10-CM | POA: Diagnosis not present

## 2023-06-18 DIAGNOSIS — F4323 Adjustment disorder with mixed anxiety and depressed mood: Secondary | ICD-10-CM | POA: Diagnosis not present

## 2023-06-23 ENCOUNTER — Ambulatory Visit: Payer: BC Managed Care – PPO

## 2023-06-23 ENCOUNTER — Other Ambulatory Visit: Payer: Self-pay | Admitting: Family Medicine

## 2023-06-23 NOTE — Progress Notes (Deleted)
Patient ID: Benjamin [name]                 DOB: 01-16-1991                      MRN: [id number]     HPI: Benjamin [name] is a 32 y.o. male referred by Dr. Clifton James to HTN clinic. PMH is significant for HTN, anxiety, depression, asthma, ADHD, and Factor 5 Leiden deficiency.   First seen by Dr Clifton James 10/22/22 for evaluation of HTN. SBP had been 210 on 10/21/22 at surgery center for sinus surgery. Pt reported white coat HTN. Was started on losartan 25mg  daily at that time. Echo 11/11/22 with EF 50-55%. BP remained elevated at 178/120 with HR 118 at f/u visit on 11/19/22. Home SBP 170-180 range. His losartan was increased to 50mg  daily and he was referred to PharmD for follow up. CT angio 12/03/22 showed no evidence of dilation of the ascending aorta or any abnormalities of the thoracic or abdominal aorta, no evidence of renal artery stenosis. Saw his PCP on 12/05/22 where BP was 140/120 and he was changed to losartan-HCTZ 100-25mg .  At last visit with HTN clinic on 12/12/2022, patient's BP was 120/80 mmHg. However, patient reported fatigue and pain in his lymph nodes after starting losartan-HCTZ. Patient reported temporarily stopping Ritalin, chronic use of Afrin and ibuprofen, and drinking 1 energy drink per day. Losartan-HCTZ was switched to irbesartan 300 mg daily and chlorthalidone 25 mg daily. Patient was counseled on monitoring BP readings at home and bringing them into future visits. Patient has rescheduled follow. He did send BP readings to Dr. Durene Cal in April in order to get a refill of Methylphenidate. BP was in the 120's/70's-low 80's. Still needs BMP  Dizziness, lightheadedness, headache, blurred vision, SOB, swelling   Current HTN meds: Irbesartan 300 mg daily, chlorthalidone 25 mg daily Previously tried: Losartan-HCTZ BP goal: < 120/80 mmHg given age  Family History: Mother - HTN, CAD; father - HTN, alcoholism; paternal grandfather - alcoholism.  Social History: Daily alcohol  use.  Diet: Watches salt intake.  Exercise: Plays tennis 3 days per week.  Home BP readings:   Wt Readings from Last 3 Encounters:  12/05/22 208 lb (94.3 kg)  11/19/22 209 lb 9.6 oz (95.1 kg)  10/22/22 208 lb 9.6 oz (94.6 kg)   BP Readings from Last 3 Encounters:  12/12/22 120/88  12/05/22 (!) 140/120  11/19/22 (!) 178/120   Pulse Readings from Last 3 Encounters:  12/12/22 98  12/05/22 90  11/19/22 (!) 118    Renal function: CrCl cannot be calculated (Patient's most recent lab result is older than the maximum 21 days allowed.).  Past Medical History:  Diagnosis Date   ADHD    Allergy    Anxiety    Asthma    only before sports   Depression    Factor 5 Leiden mutation, heterozygous [facility])     Current Outpatient Medications on File Prior to Visit  Medication Sig Dispense Refill   busPIRone (BUSPAR) 10 MG tablet Take 1 tablet (10 mg total) by mouth 2 (two) times daily. 180 tablet 3   chlorthalidone (HYGROTON) 25 MG tablet Take 1 tablet (25 mg total) by mouth daily. 90 tablet 3   escitalopram (LEXAPRO) 10 MG tablet TAKE 1 AND 1/2 TABLETS(15 MG) BY MOUTH DAILY 135 tablet 0   hydrOXYzine (ATARAX) 25 MG tablet Take 1 tablet (25 mg total) by mouth every 8 (eight) hours as needed.  30 tablet 5   irbesartan (AVAPRO) 300 MG tablet Take 1 tablet (300 mg total) by mouth daily. 90 tablet 3   methylphenidate (RITALIN) 10 MG tablet Take 1 tablet (10 mg total) by mouth 3 (three) times daily with meals. 90 tablet 0   methylphenidate (RITALIN) 10 MG tablet Take 1 tablet (10 mg total) by mouth 3 (three) times daily with meals. Fill in 1 month 90 tablet 0   methylphenidate (RITALIN) 10 MG tablet Take 1 tablet (10 mg total) by mouth 3 (three) times daily with meals. Fill in 2 months 90 tablet 0   No current facility-administered medications on file prior to visit.    Allergies  Allergen Reactions   Penicillins      Assessment/Plan:  1. Hypertension -

## 2023-06-23 NOTE — Telephone Encounter (Signed)
See pt message on rx below.

## 2023-06-24 ENCOUNTER — Encounter: Payer: Self-pay | Admitting: Family Medicine

## 2023-06-24 ENCOUNTER — Telehealth: Payer: BC Managed Care – PPO | Admitting: Family Medicine

## 2023-06-24 VITALS — BP 120/88 | Ht 70.0 in | Wt 200.0 lb

## 2023-06-24 DIAGNOSIS — I10 Essential (primary) hypertension: Secondary | ICD-10-CM

## 2023-06-24 DIAGNOSIS — D6851 Activated protein C resistance: Secondary | ICD-10-CM

## 2023-06-24 DIAGNOSIS — F988 Other specified behavioral and emotional disorders with onset usually occurring in childhood and adolescence: Secondary | ICD-10-CM | POA: Diagnosis not present

## 2023-06-24 DIAGNOSIS — K5190 Ulcerative colitis, unspecified, without complications: Secondary | ICD-10-CM | POA: Diagnosis not present

## 2023-06-24 DIAGNOSIS — F411 Generalized anxiety disorder: Secondary | ICD-10-CM

## 2023-06-24 MED ORDER — METHYLPHENIDATE HCL 10 MG PO TABS: 10.0000 mg | ORAL_TABLET | Freq: Three times a day (TID) | ORAL | 0 refills | Status: DC

## 2023-06-24 MED ORDER — HYDROXYZINE HCL 25 MG PO TABS: 37.5000 mg | ORAL_TABLET | Freq: Three times a day (TID) | ORAL | 5 refills | Status: DC | PRN

## 2023-06-24 MED ORDER — ESCITALOPRAM OXALATE 10 MG PO TABS: 15.0000 mg | ORAL_TABLET | Freq: Every day | ORAL | 1 refills | Status: DC

## 2023-06-24 NOTE — Telephone Encounter (Signed)
Patient has been informed of message below and verbalized understanding. He has been placed @ 11:40 am on 9/18 per provider note below.

## 2023-06-24 NOTE — Progress Notes (Signed)
Phone [phone] Virtual visit via Video note   Subjective:  Chief complaint: Chief Complaint  Patient presents with   ADHD    Our team/I connected with Benjamin [name] at 11:40 AM EDT by a video enabled telemedicine application (caregility through epic) and verified that I am speaking with the correct person using two identifiers. Our team/I discussed the limitations of evaluation and management by telemedicine and the availability of in person appointments.No physical exam was performed (except for noted visual exam or audio findings with Telehealth visits).   Location patient: [place] Location provider: [facility], office Persons participating in the virtual visit:  patient  Past Medical History-  Patient Active Problem List   Diagnosis Date Noted   Ulcerative colitis (HCC) 04/07/2019    Priority: High   Factor V Leiden mutation (HCC) 11/28/2016    Priority: High   Attention deficit disorder (ADD) in adult 05/25/2012    Priority: High   Hyperlipemia 12/05/2022    Priority: Medium    White coat syndrome with diagnosis of hypertension 07/31/2018    Priority: Medium    Generalized anxiety disorder 02/20/2013    Priority: Medium    Somatic dysfunction of spine, cervical 04/30/2021    Priority: 1.   Chronic neck pain 01/16/2021    Priority: 1.    Medications- reviewed and updated Current Outpatient Medications  Medication Sig Dispense Refill   busPIRone (BUSPAR) 10 MG tablet Take 1 tablet (10 mg total) by mouth 2 (two) times daily. 180 tablet 3   chlorthalidone (HYGROTON) 25 MG tablet Take 1 tablet (25 mg total) by mouth daily. 90 tablet 3   irbesartan (AVAPRO) 300 MG tablet Take 1 tablet (300 mg total) by mouth daily. 90 tablet 3   escitalopram (LEXAPRO) 10 MG tablet Take 1.5 tablets (15 mg total) by mouth daily. 135 tablet 1   hydrOXYzine (ATARAX) 25 MG tablet Take 1.5 tablets (37.5 mg total) by mouth every 8 (eight) hours as needed. 45 tablet 5    methylphenidate (RITALIN) 10 MG tablet Take 1 tablet (10 mg total) by mouth 3 (three) times daily with meals. 90 tablet 0   methylphenidate (RITALIN) 10 MG tablet Take 1 tablet (10 mg total) by mouth 3 (three) times daily with meals. Fill in 1 month 90 tablet 0   methylphenidate (RITALIN) 10 MG tablet Take 1 tablet (10 mg total) by mouth 3 (three) times daily with meals. Fill in 2 months 90 tablet 0   No current facility-administered medications for this visit.     Objective:  BP 120/88 Comment: Most recent home blood pressure reading  Ht 5\' 10"  (1.778 m)   Wt 200 lb (90.7 kg)   BMI 28.70 kg/m  self reported vitals Gen: NAD, resting comfortably Lungs: nonlabored, normal respiratory rate  Skin: appears dry, no obvious rash     Assessment and Plan   # Social update-graduated from college!  #White Coat Syndrome with hypertension S: medication(s): chlorthalidone 25 mg , irbesartan 300mg  - 1-2 x a week checking and 120/85-88 -he cut out alcohol in the week and minimal on weekend and helping -he has also started running -still trying to discontinue his long term afrin use which could raise his blood pressure but thankfully blood pressure controlled BP Readings from Last 3 Encounters:  06/24/23 120/88  12/12/22 120/88  12/05/22 (!) 140/120  A/P: Well-controlled at home-continue current medication  #Adult ADD S: Compliant with Ritalin 10 mg three times daily. Depression with extended release version in the  past.  Reports working reasonably well -Diagnosed 2011 by Sharmon Revere, [place], [facility] -[place] reviewed today - no high risk use -UDS updated march 2023- due next visit - Controlled substance contract March 26, 2018 A/P: Well-controlled-continue current medication.  Discussed needs in person visit at follow-up and we need to update urine drug screen  #Generalized anxiety disorder/OCD reported by therapist S: Medication: Escitalopram 15 mg, buspirone 15 mg  twice daily, hydroxyzine 37.5 mg before bed-he reports trialed this in more effective than 25 mg-I am okay with this dose -Years ago on Zoloft for depression and GAD in high school -Feels mentally slowed on 20 mg escitalopram A/P: Reports reasonable control with current regimen-continue current medication  #Factor V Leiden mutation-heterozygous S: Patient does not want lifelong anticoagulation unless DVT or PE.  Dr. Cyndie Chime actually wrote the prescription for the original test- he has not formally seen patient in the office but was aware of the results.  No shortness of breath or leg swelling reported A/P: Doing well without intervention-has upcoming flight and plans to wear compression stockings and try to pump the calves on the flight   % Ulcerative Colitis-follow-up with Dr. Ewing Schlein of the [facility] S: Patient was referred to [facility] due to recurrent abdominal issues as well as elevated ESR and CRP.   A/P: No recent issues and no medications reported-continue to monitor  Recommended follow up: 6 months physical-discussed importance of this being in person  Lab/Order associations:   ICD-10-CM   1. White coat syndrome with diagnosis of hypertension  I10     2. Attention deficit disorder (ADD) in adult  F98.8     3. Ulcerative colitis without complications, unspecified location [facility]) Chronic K51.90     4. Factor V Leiden mutation (HCC) Chronic D68.51     5. Generalized anxiety disorder  F41.1       Meds ordered this encounter  Medications   methylphenidate (RITALIN) 10 MG tablet    Sig: Take 1 tablet (10 mg total) by mouth 3 (three) times daily with meals.    Dispense:  90 tablet    Refill:  0   methylphenidate (RITALIN) 10 MG tablet    Sig: Take 1 tablet (10 mg total) by mouth 3 (three) times daily with meals. Fill in 1 month    Dispense:  90 tablet    Refill:  0   methylphenidate (RITALIN) 10 MG tablet    Sig: Take 1 tablet (10 mg total) by mouth 3 (three) times daily with  meals. Fill in 2 months    Dispense:  90 tablet    Refill:  0   hydrOXYzine (ATARAX) 25 MG tablet    Sig: Take 1.5 tablets (37.5 mg total) by mouth every 8 (eight) hours as needed.    Dispense:  45 tablet    Refill:  5   escitalopram (LEXAPRO) 10 MG tablet    Sig: Take 1.5 tablets (15 mg total) by mouth daily.    Dispense:  135 tablet    Refill:  1    Return precautions advised.  Tana Conch, MD

## 2023-06-24 NOTE — Assessment & Plan Note (Signed)
#  Generalized anxiety disorder/OCD reported by therapist S: Medication: Escitalopram 15 mg, buspirone 15 mg twice daily, hydroxyzine 37.5 mg before bed-he reports trialed this in more effective than 25 mg-I am okay with this dose -Years ago on Zoloft for depression and GAD in high school -Feels mentally slowed on 20 mg escitalopram A/P: Reports reasonable control with current regimen-continue current medication

## 2023-06-24 NOTE — Assessment & Plan Note (Signed)
S: medication(s): chlorthalidone 25 mg , irbesartan 300mg  - 1-2 x a week checking and 120/85-88 -he cut out alcohol in the week and minimal on weekend and helping -he has also started running -still trying to discontinue his long term afrin use which could raise his blood pressure but thankfully blood pressure controlled BP Readings from Last 3 Encounters:  12/12/22 120/88  12/05/22 (!) 140/120  11/19/22 (!) 178/120  A/P: Well-controlled at home-continue current medication

## 2023-06-24 NOTE — Telephone Encounter (Signed)
This was sent in today at visit but its not allowing me to close it out, can you refuse this please.

## 2023-06-24 NOTE — Telephone Encounter (Signed)
Patient requests to be called re: Patient received MyChart message that the RX's for methylphenidate (RITALIN) 10 MG tablet have been denied

## 2023-06-24 NOTE — Telephone Encounter (Signed)
See below regarding scheduling for further refills.

## 2023-07-01 ENCOUNTER — Other Ambulatory Visit: Payer: Self-pay | Admitting: Family Medicine

## 2023-07-02 DIAGNOSIS — F4323 Adjustment disorder with mixed anxiety and depressed mood: Secondary | ICD-10-CM | POA: Diagnosis not present

## 2023-07-16 DIAGNOSIS — F4323 Adjustment disorder with mixed anxiety and depressed mood: Secondary | ICD-10-CM | POA: Diagnosis not present

## 2023-07-30 DIAGNOSIS — F4323 Adjustment disorder with mixed anxiety and depressed mood: Secondary | ICD-10-CM | POA: Diagnosis not present

## 2023-08-14 DIAGNOSIS — F4323 Adjustment disorder with mixed anxiety and depressed mood: Secondary | ICD-10-CM | POA: Diagnosis not present

## 2023-08-20 DIAGNOSIS — F4323 Adjustment disorder with mixed anxiety and depressed mood: Secondary | ICD-10-CM | POA: Diagnosis not present

## 2023-11-03 ENCOUNTER — Other Ambulatory Visit: Payer: Self-pay | Admitting: Family Medicine

## 2023-11-03 MED ORDER — METHYLPHENIDATE HCL 10 MG PO TABS: 10.0000 mg | ORAL_TABLET | Freq: Three times a day (TID) | ORAL | 0 refills | Status: DC

## 2023-11-03 NOTE — Telephone Encounter (Signed)
Dr. Durene Cal is willing to fill this hence the situation, however please call and get pt scheduled for an appointment before he will send the refill in.

## 2023-11-03 NOTE — Telephone Encounter (Signed)
See message on Rx

## 2023-11-03 NOTE — Telephone Encounter (Signed)
FYI - Pt has appt on 12/02/23 for office visit.

## 2023-11-03 NOTE — Telephone Encounter (Signed)
I am willing to fill this but have him schedule the appointment first please

## 2023-12-02 ENCOUNTER — Ambulatory Visit: Payer: Self-pay | Admitting: Family Medicine

## 2023-12-10 ENCOUNTER — Other Ambulatory Visit: Payer: Self-pay | Admitting: Cardiovascular Disease

## 2023-12-10 ENCOUNTER — Other Ambulatory Visit: Payer: Self-pay | Admitting: Family Medicine

## 2023-12-13 ENCOUNTER — Other Ambulatory Visit: Payer: Self-pay | Admitting: Family Medicine

## 2023-12-18 ENCOUNTER — Ambulatory Visit: Payer: Self-pay | Admitting: Family Medicine

## 2023-12-24 ENCOUNTER — Other Ambulatory Visit: Payer: Self-pay | Admitting: Cardiovascular Disease

## 2024-01-07 ENCOUNTER — Other Ambulatory Visit: Payer: Self-pay | Admitting: Cardiovascular Disease

## 2024-02-02 ENCOUNTER — Other Ambulatory Visit: Payer: Self-pay | Admitting: Cardiovascular Disease

## 2024-02-03 ENCOUNTER — Other Ambulatory Visit: Payer: Self-pay | Admitting: Family Medicine

## 2024-02-03 NOTE — Telephone Encounter (Signed)
 Copied from CRM [id number]. Topic: Clinical - Medication Refill >> Feb 03, 2024  1:38 PM Ovid Blow wrote: Most Recent Primary Care Visit:  Provider: Almira Jaeger  Department: [facility]  Visit Type: MYCHART VIDEO VISIT  Date: 06/24/2023  Medication: methylphenidate  (RITALIN ) 10 MG tablet  Has the patient contacted their pharmacy? No (Agent: If no, request that the patient contact the pharmacy for the refill. If patient does not wish to contact the pharmacy document the reason why and proceed with request.) (Agent: If yes, when and what did the pharmacy advise?)  Is this the correct pharmacy for this prescription? Yes If no, delete pharmacy and type the correct one.  This is the patient's preferred pharmacy:  [id number] [place], [place] - [address] [place] [address] Phone: [phone] Fax: [phone]   Has the prescription been filled recently? No  Is the patient out of the medication? No  Has the patient been seen for an appointment in the last year OR does the patient have an upcoming appointment? Yes  Can we respond through MyChart? Yes  Agent: Please be advised that Rx refills may take up to 3 business days. We ask that you follow-up with your pharmacy.

## 2024-02-03 NOTE — Telephone Encounter (Unsigned)
 Copied from CRM [id number]. Topic: Appointments - Appointment Scheduling >> Feb 03, 2024  1:30 PM Benjamin [name] wrote: Patient/patient representative is calling to schedule an appointment. Refer to attachments for appointment information.

## 2024-02-05 ENCOUNTER — Other Ambulatory Visit: Payer: Self-pay | Admitting: Family Medicine

## 2024-02-05 MED ORDER — METHYLPHENIDATE HCL 10 MG PO TABS: 10.0000 mg | ORAL_TABLET | Freq: Three times a day (TID) | ORAL | 0 refills | Status: DC

## 2024-02-16 ENCOUNTER — Ambulatory Visit: Payer: Self-pay | Admitting: Family Medicine

## 2024-03-04 ENCOUNTER — Ambulatory Visit: Payer: Self-pay | Admitting: Family Medicine

## 2024-03-22 ENCOUNTER — Ambulatory Visit: Payer: Self-pay | Admitting: Family Medicine

## 2024-04-13 ENCOUNTER — Ambulatory Visit: Payer: Self-pay | Admitting: Family Medicine

## 2024-04-21 ENCOUNTER — Other Ambulatory Visit: Payer: Self-pay | Admitting: Family Medicine

## 2024-04-22 NOTE — Progress Notes (Signed)
 Cardiology Office Note   Date:  04/25/2024  ID:  Benjamin [name], DOB 09/05/1991, MRN [id number] PCP: Benjamin Garnette KIDD, MD  [facility] Providers Cardiologist:  Lonni Cash, MD {  History of Present Illness Benjamin [name] is a 33 y.o. male with a past medical history of HTN, anxiety, depression, and asthma who is here for follow-up appointment.  He was last seen by Dr. Cash February of last year for evaluation of hypertension.  He was at the surgery center January of last year for sinus surgery and BP was 210 systolic.  Procedure was canceled.  Had no complaints at his first visit.  Reports having whitecoat syndrome.  Started him on losartan  25 daily.  Echocardiogram in February of last year showed LVEF 50 to 55%, moderate LVH, no valvular disease.  On follow-up he denied any chest pain, dyspnea, palpitations, lower extremity edema, orthopnea, PND, dizziness, syncope/near syncope.  Blood pressure has been 170-1 80 systolic at home consistently.  Today, he presents with hypertension who presents for blood pressure management.  Hypertension was initially noted to be significantly elevated approximately a year and a half ago. He was started on losartan , which has been titrated over time. He was also prescribed irbesartan  and Hyzaar. He has run out of irbesartan  and has been taking losartan , which he feels has provided better results. For the past couple of months, he has not been taking chlorthalidone  or irbesartan . His recent blood pressure reading was 132/86 mmHg.  Family history is significant for cardiovascular issues, particularly on his mother's side. His mother has undergone double bypass surgery and has a history of high blood pressure.  He is scheduled to have labs done in August, including a basic metabolic panel to monitor kidney function due to his use of ARBs.  Reports no shortness of breath nor dyspnea on exertion. Reports no chest pain,  pressure, or tightness. No edema, orthopnea, PND. Reports no palpitations.    Discussed the use of AI scribe software for clinical note transcription with the patient, who gave verbal consent to proceed.   ROS: Pertinent ROS in HPI  Studies Reviewed EKG Interpretation Date/Time:  Monday April 25 2024 11:14:00 EDT Ventricular Rate:  78 PR Interval:  146 QRS Duration:  96 QT Interval:  386 QTC Calculation: 440 R Axis:   35  Text Interpretation: Normal sinus rhythm Normal ECG No previous ECGs available Confirmed by Lucien Blanc [phone]) on 04/25/2024 11:33:05 AM    Echo 11/11/22:  1. Left ventricular ejection fraction, by estimation, is 50 to 55%. Left  ventricular ejection fraction by 3D volume is 51 %. The left ventricle has  low normal function. The left ventricle has no regional wall motion  abnormalities. There is moderate left   ventricular hypertrophy. Left ventricular diastolic parameters are  consistent with Grade I diastolic dysfunction (impaired relaxation). The  average left ventricular global longitudinal strain is -12.7 %. The global  longitudinal strain is abnormal.   2. Right ventricular systolic function is normal. The right ventricular  size is mildly enlarged. There is normal pulmonary artery systolic  pressure. The estimated right ventricular systolic pressure is 24.3 mmHg.   3. The mitral valve is abnormal. Trivial mitral valve regurgitation.   4. The aortic valve is tricuspid. Aortic valve regurgitation is not  visualized.   5. Aortic dilatation noted. There is borderline dilatation of the  ascending aorta, measuring 39 mm.   6. The inferior vena cava is normal in size with greater than  50%  respiratory variability, suggesting right atrial pressure of 3 mmHg.    CTA chest 11/2022 MPRESSION: 1. No acute vascular or nonvascular abnormality within the chest, abdomen or pelvis. 2. No CTA evidence of renal artery stenosis. 3. Incidental and senescent findings as  above.      Physical Exam VS:  BP 132/86   Pulse 68   Ht 5' 10 (1.778 m)   Wt 219 lb (99.3 kg)   SpO2 96%   BMI 31.42 kg/m        Wt Readings from Last 3 Encounters:  04/25/24 219 lb (99.3 kg)  06/24/23 200 lb (90.7 kg)  12/05/22 208 lb (94.3 kg)    GEN: Well nourished, well developed in no acute distress NECK: No JVD; No carotid bruits CARDIAC: RRR, no murmurs, rubs, gallops RESPIRATORY:  Clear to auscultation without rales, wheezing or rhonchi  ABDOMEN: Soft, non-tender, non-distended EXTREMITIES:  No edema; No deformity   ASSESSMENT AND PLAN  Hypertension Blood pressure managed with losartan  HCTZ. Current reading 132/86 mmHg. Discussed potential switch to irbesartan  if needed. Emphasized regular monitoring and risks of thiazide diuretics. - Continue losartan  HCTZ with refills. - Monitor blood pressure daily using Omeron cuff. - Record readings daily, post-Hyzaar. - Send values via MyChart if increased. - Annual BMP to monitor kidney function. - Request PCP to send lab results to cardiology.     Dispo: He can follow-up in a year  Signed, Orren LOISE Fabry, PA-C

## 2024-04-25 ENCOUNTER — Encounter: Payer: Self-pay | Admitting: Physician Assistant

## 2024-04-25 ENCOUNTER — Ambulatory Visit: Attending: Physician Assistant | Admitting: Physician Assistant

## 2024-04-25 VITALS — BP 132/86 | HR 68 | Ht 70.0 in | Wt 219.0 lb

## 2024-04-25 DIAGNOSIS — I10 Essential (primary) hypertension: Secondary | ICD-10-CM

## 2024-04-25 MED ORDER — LOSARTAN POTASSIUM-HCTZ 100-25 MG PO TABS: 1.0000 | ORAL_TABLET | Freq: Every day | ORAL | 3 refills | Status: AC

## 2024-04-25 NOTE — Patient Instructions (Signed)
 Medication Instructions:   STOP TAKING CHLORTHALIDONE    STOP TAKING IRBESARTAN  (AVAPRO )    *If you need a refill on your cardiac medications before your next appointment, please call your pharmacy*    Follow-Up: At [facility], you and your health needs are our priority.  As part of our continuing mission to provide you with exceptional heart care, our providers are all part of one team.  This team includes your primary Cardiologist (physician) and Advanced Practice Providers or APPs (Physician Assistants and Nurse Practitioners) who all work together to provide you with the care you need, when you need it.  Your next appointment:   1 year(s)  Provider:   Lonni Cash, MD

## 2024-05-23 ENCOUNTER — Ambulatory Visit: Payer: Self-pay | Admitting: Family Medicine

## 2024-05-23 ENCOUNTER — Ambulatory Visit (INDEPENDENT_AMBULATORY_CARE_PROVIDER_SITE_OTHER): Admitting: Family Medicine

## 2024-05-23 ENCOUNTER — Encounter: Payer: Self-pay | Admitting: Family Medicine

## 2024-05-23 ENCOUNTER — Ambulatory Visit (HOSPITAL_COMMUNITY)
Admission: RE | Admit: 2024-05-23 | Discharge: 2024-05-23 | Disposition: A | Discharge status: Home / Self Care | Source: Ambulatory Visit | Attending: Family Medicine | Admitting: Family Medicine

## 2024-05-23 VITALS — BP 122/88 | HR 118 | Temp 98.0°F | Ht 70.0 in | Wt 212.6 lb

## 2024-05-23 DIAGNOSIS — R1031 Right lower quadrant pain: Secondary | ICD-10-CM | POA: Diagnosis not present

## 2024-05-23 DIAGNOSIS — Z131 Encounter for screening for diabetes mellitus: Secondary | ICD-10-CM

## 2024-05-23 DIAGNOSIS — F988 Other specified behavioral and emotional disorders with onset usually occurring in childhood and adolescence: Secondary | ICD-10-CM

## 2024-05-23 DIAGNOSIS — E785 Hyperlipidemia, unspecified: Secondary | ICD-10-CM

## 2024-05-23 DIAGNOSIS — I10 Essential (primary) hypertension: Secondary | ICD-10-CM | POA: Diagnosis not present

## 2024-05-23 DIAGNOSIS — E669 Obesity, unspecified: Secondary | ICD-10-CM

## 2024-05-23 DIAGNOSIS — K51919 Ulcerative colitis, unspecified with unspecified complications: Secondary | ICD-10-CM

## 2024-05-23 DIAGNOSIS — K353 Acute appendicitis with localized peritonitis, without perforation or gangrene: Secondary | ICD-10-CM

## 2024-05-23 DIAGNOSIS — Z79899 Other long term (current) drug therapy: Secondary | ICD-10-CM

## 2024-05-23 LAB — CBC WITH DIFFERENTIAL/PLATELET
Basophils Absolute: 0 K/uL (ref 0.0–0.1)
Basophils Relative: 0.6 % (ref 0.0–3.0)
Eosinophils Absolute: 0 K/uL (ref 0.0–0.7)
Eosinophils Relative: 0.4 % (ref 0.0–5.0)
HCT: 38.8 % — ABNORMAL LOW (ref 39.0–52.0)
Hemoglobin: 13.5 g/dL (ref 13.0–17.0)
Lymphocytes Relative: 26.3 % (ref 12.0–46.0)
Lymphs Abs: 1.8 K/uL (ref 0.7–4.0)
MCHC: 34.7 g/dL (ref 30.0–36.0)
MCV: 90.1 fl (ref 78.0–100.0)
Monocytes Absolute: 0.9 K/uL (ref 0.1–1.0)
Monocytes Relative: 12.9 % — ABNORMAL HIGH (ref 3.0–12.0)
Neutro Abs: 4.2 K/uL (ref 1.4–7.7)
Neutrophils Relative %: 59.8 % (ref 43.0–77.0)
Platelets: 381 K/uL (ref 150.0–400.0)
RBC: 4.31 Mil/uL (ref 4.22–5.81)
RDW: 13.7 % (ref 11.5–15.5)
WBC: 7 K/uL (ref 4.0–10.5)

## 2024-05-23 LAB — COMPREHENSIVE METABOLIC PANEL WITH GFR
ALT: 33 U/L (ref 0–53)
AST: 19 U/L (ref 0–37)
Albumin: 4.5 g/dL (ref 3.5–5.2)
Alkaline Phosphatase: 91 U/L (ref 39–117)
BUN: 14 mg/dL (ref 6–23)
CO2: 28 meq/L (ref 19–32)
Calcium: 9.5 mg/dL (ref 8.4–10.5)
Chloride: 99 meq/L (ref 96–112)
Creatinine, Ser: 1.1 mg/dL (ref 0.40–1.50)
GFR: 89.01 mL/min (ref >60.00)
Glucose, Bld: 82 mg/dL (ref 70–99)
Potassium: 3.4 meq/L — ABNORMAL LOW (ref 3.5–5.1)
Sodium: 136 meq/L (ref 135–145)
Total Bilirubin: 0.5 mg/dL (ref 0.2–1.2)
Total Protein: 7.8 g/dL (ref 6.0–8.3)

## 2024-05-23 LAB — C-REACTIVE PROTEIN: CRP: 4.6 mg/dL (ref 0.5–20.0)

## 2024-05-23 LAB — SEDIMENTATION RATE: Sed Rate: 34 mm/h — ABNORMAL HIGH (ref 0–15)

## 2024-05-23 LAB — LIPID PANEL
Cholesterol: 191 mg/dL (ref 0–200)
HDL: 46.6 mg/dL (ref >39.00)
LDL Cholesterol: 122 mg/dL — ABNORMAL HIGH (ref 0–99)
NonHDL: 144.31
Total CHOL/HDL Ratio: 4
Triglycerides: 111 mg/dL (ref 0.0–149.0)
VLDL: 22.2 mg/dL (ref 0.0–40.0)

## 2024-05-23 LAB — TSH: TSH: 1.05 u[IU]/mL (ref 0.35–5.50)

## 2024-05-23 LAB — HEMOGLOBIN A1C: Hgb A1c MFr Bld: 5.9 % (ref 4.6–6.5)

## 2024-05-23 MED ORDER — IOHEXOL 300 MG/ML  SOLN
100.0000 mL | Freq: Once | INTRAMUSCULAR | Status: AC | PRN
  Administered 2024-05-23: 100 mL via INTRAVENOUS

## 2024-05-23 MED ORDER — METHYLPHENIDATE HCL 10 MG PO TABS: 10.0000 mg | ORAL_TABLET | Freq: Three times a day (TID) | ORAL | 0 refills | Status: DC

## 2024-05-23 NOTE — Progress Notes (Signed)
 Addendum: 1.  Concern for colitis-patient agrees to call Dr. Rosalie in the morning to try to schedule as soon as possible follow-up 2.  Recommended emergency department visit this evening.  He states he is feeling about the same to slightly better.  We discussed with his mother as well and there is strong preference is to see general surgery in the office tomorrow and seek care only if symptoms worsen in the evening .  We discussed data on antibiotics and appendicitis is not well-documented for ulcerative colitis patients so we did not proceed down that route.  I will place urgent referral to [facility] surgery

## 2024-05-23 NOTE — Addendum Note (Signed)
 Addended by: KATRINKA GARNETTE KIDD on: 05/23/2024 05:05 PM   Modules accepted: Orders

## 2024-05-23 NOTE — Patient Instructions (Addendum)
 Schedule follow up with Dr. Rosalie  Stat CT- team will reach out to you  Concern for appendicitis- stat CT  Please stop by lab before you go If you have mychart- we will send your results within 3 business days of us  receiving them.  If you do not have mychart- we will call you about results within 5 business days of us  receiving them.  *please also note that you will see labs on mychart as soon as they post. I will later go in and write notes on them- will say notes from Dr. Katrinka   Recommended follow up: Return in about 6 months (around 11/23/2024) for physical or sooner if needed.Schedule b4 you leave. -particularly want to reevaluate  or have you seek care if fever, chills, worsening pain- would go straight to Emergency Department       AI PLAN: RIGHT LOWER QUADRANT ABDOMINAL PAIN: You have acute pain in your right lower abdomen, which could be due to appendicitis or a flare-up of your ulcerative colitis. -We will order a stat CT scan of your abdomen to rule out appendicitis. -Blood work will be done to check your blood counts, kidney and liver function, electrolytes, sedimentation rate, and CRP (inflammatory markers) -If you experience fever, chills, or worsening pain, please go to the emergency department immediately.  ULCERATIVE COLITIS: Your ulcerative colitis may be flaring up, contributing to your current symptoms. -We will check your sedimentation rate and CRP as part of your blood work. -Please follow up with your gastroenterologist, Dr. Rosalie, for further evaluation and management.  HYPERTENSION: Your blood pressure is well-controlled with losartan -hydrochlorothiazide ,  but reducing Afrin use may help further. -Continue taking medications as prescribed -Try to discontinue Afrin to potentially reduce the need for blood pressure medication. -keep working on weight loss and alcohol reduction- both of these help as well  ATTENTION DEFICIT DISORDER: Your ADD is currently  managed with Ritalin . -We will refill your Ritalin  prescription- did 3 months today  GENERALIZED ANXIETY DISORDER: Your anxiety is managed with escitalopram , buspirone , and therapy. -Continue taking escitalopram  and buspirone  as prescribed. -Continue attending therapy sessions twice a month.  GENERAL HEALTH MAINTENANCE: You are working on improving your diet and reducing alcohol intake. -Continue to reduce your alcohol intake to improve your overall health and potentially further improve your blood pressure. -Keep up with your efforts in dietary improvements and meal prepping.                      Contains text generated by Abridge.

## 2024-05-23 NOTE — Progress Notes (Signed)
 Phone [phone] In person visit   Subjective:   Benjamin [name] is a 33 y.o. year old very pleasant male patient who presents for/with See problem oriented charting Chief Complaint  Patient presents with   Medication Refill   Medical Management of Chronic Issues    Would like to speak about sharp pains in abdomen, possibly appendix;    Discussed the use of AI scribe software for clinical note transcription with the patient, who gave verbal consent to proceed.    Past Medical History-  Patient Active Problem List   Diagnosis Date Noted   Ulcerative colitis (HCC) 04/07/2019    Priority: High   Factor V Leiden mutation (HCC) 11/28/2016    Priority: High   Attention deficit disorder (ADD) in adult 05/25/2012    Priority: High   Hyperlipemia 12/05/2022    Priority: Medium    White coat syndrome with diagnosis of hypertension 07/31/2018    Priority: Medium    Generalized anxiety disorder 02/20/2013    Priority: Medium    Somatic dysfunction of spine, cervical 04/30/2021    Priority: 1.   Chronic neck pain 01/16/2021    Priority: 1.    Medications- reviewed and updated Current Outpatient Medications  Medication Sig Dispense Refill   busPIRone  (BUSPAR ) 10 MG tablet TAKE 1 TABLET BY MOUTH TWICE A DAY 180 tablet 2   escitalopram  (LEXAPRO ) 10 MG tablet TAKE 1 AND 1/2 TABLETS DAILY BY MOUTH 135 tablet 1   hydrOXYzine  (ATARAX ) 25 MG tablet TAKE 1 AND 1/2 TABLETS BY MOUTH EVERY 8 HOURS AS NEEDED 270 tablet 1   losartan -hydrochlorothiazide  (HYZAAR) 100-25 MG tablet Take 1 tablet by mouth daily. 90 tablet 3   methylphenidate  (RITALIN ) 10 MG tablet Take 1 tablet (10 mg total) by mouth 3 (three) times daily with meals. Fill in 1 month 90 tablet 0   methylphenidate  (RITALIN ) 10 MG tablet Take 1 tablet (10 mg total) by mouth 3 (three) times daily with meals. Fill in 2 months 90 tablet 0   methylphenidate  (RITALIN ) 10 MG tablet Take 1 tablet (10 mg total) by mouth 3 (three)  times daily with meals. 90 tablet 0   No current facility-administered medications for this visit.     Objective:  BP 122/88 (BP Location: Left Arm, Patient Position: Sitting, Cuff Size: Normal)   Pulse (!) 118   Temp 98 F (36.7 C) (Temporal)   Ht 5' 10 (1.778 m)   Wt 212 lb 9.6 oz (96.4 kg)   SpO2 97%   BMI 30.50 kg/m  Gen: NAD, resting comfortably, well appearing CV:  tachycardic bug regular, no murmurs rubs or gallops Lungs: CTAB no crackles, wheeze, rhonchi Abdomen: soft but very tender particularly in RLQ- gets referred pain to this area with compression in other sites. Mild suprapubic pain but still refers to RLQ. No guarding. Does have rebound in RLQ over mcburneys point. Nondistended. Normal bowel sounds Ext: no edema Skin: warm, dry     Assessment and Plan   # RLQ Abdominal pain S:Right lower quadrant abdominal pain - Onset in the middle of the night with sharp, tender pain localized to the right lower quadrant, identified as the location of the appendix - Initial pain intensity rated 7/10, currently 5-6/10 when sitting or with palpation, increases to 7/10 with ambulation - Pain quality is sharper and different from baseline abdominal discomfort  Gastrointestinal symptoms - History of ulcerative colitis, not on medication and no specialist follow-up for years - Chronic loose stools for several  months, but recently experiencing difficulty with bowel movements-folic it was difficult to have bowel movement this morning initially - One loose bowel movement this morning without symptom relief - No recent hematochezia, but had a few episodes of bloody stools earlier in the summer - No vomiting - Nausea present throughout the night - Chills and alternating sensations of feeling hot and cold throughout the night - Lethargy -Does use THC Gummies for sleep but has tolerated these without issues -Reports not eating as well over the last 6 months - Consumes 10-15 alcoholic  beverages per week - Attempting to improve diet and reduce alcohol intake     Patient was referred to [facility] due to recurrent abdominal issues as well as elevated ESR and CRP.  Formally diagnosed on colonoscopy March 25, 2019 with ulcerative colitis with Dr. Rosalie. He has not been seen in years.   A/P: Right lower quadrant abdominal pain, rule out appendicitis Acute right lower quadrant abdominal pain with tenderness, nausea, and chills. Pain severity is 5-7/10. Differential diagnosis includes appendicitis and ulcerative colitis flare. Rebound tenderness indicates possible significant inflammation. - Order stat CT scan of the abdomen to rule out appendicitis - Perform blood work including blood counts, kidney, liver, electrolytes, sedimentation rate, and ESR - Advise to go to the emergency department if fever, chills, or worsening pain occur  Ulcerative colitis, possible flare Recent symptoms of looser stools since spring and occasional bloody stools in the summer. Possible flare contributing to current symptoms. - Check sedimentation rate and ESR as part of blood work - Recommend follow-up with gastroenterologist, Dr. Caffie     #White Coat Syndrome with hypertension S: medication(s): Losartan  hydrochlorothiazide  100-25 mg  -In 2020 discovered patient was taking intermittent decongestants and regular Afrin-we recommended discontinuation- down to 3 x a day but encouraged discontinuing permanently.    Home blood pressure: 120s/80's at home  BP Readings from Last 3 Encounters:  05/23/24 122/88  04/25/24 132/86  06/24/23 120/88  A/P: Systolic blood pressure looks excellent today.  Diastolic high acceptable-continue current medication and continue efforts for healthy eating and regular status-we have also discussed risks of ADD medicines-fortunately his home readings are better than here - Also encouraged discontinuation of Afrin once again - alcohol 10-15 a week and reducing further could  help blood pressure as well  -also plans to work on weight loss  #Adult ADD S: Compliant with Ritalin  5 in am, 10 after lunch, sparingly in evenings. Good control overall -Diagnosed 2011 by [place] Rana, [place] -[place] reviewed today - no high risk use confirmed -UDS 12/13/21 - checking today - Controlled substance contract March 26, 2018 A/P: reasonable control- continue current medications . Update urine drug screen. Refills today   #Generalized anxiety disorder/OCD reported by therapist S: Medication: Escitalopram  15 mg, buspirone  10-15 mg twice daily, hydroxyzine  37.5 mg before bed- sparingly- found THC more helpful b -therapy twice a month-remains proactive -Years ago on Zoloft  for depression and GAD in high school -Feels mentally slowed on 20 mg escitalopram     05/23/2024   10:45 AM 12/05/2022   11:24 AM 02/03/2022    1:41 PM  Depression screen PHQ 2/9  Decreased Interest 1 1 0  Down, Depressed, Hopeless 1 1 2   PHQ - 2 Score 2 2 2   Altered sleeping 2 1 3   Tired, decreased energy 2 2 3   Change in appetite 0 0 0  Feeling bad or failure about yourself  1 0 2  Trouble concentrating 1  3 0  Moving slowly or fidgety/restless 0 0 0  Suicidal thoughts 0 0 1  PHQ-9 Score 8 8 11   Difficult doing work/chores Somewhat difficult Somewhat difficult Very difficult   A/P: imperfect but tolerable control- he prefers to continue current medications   -does use THC gummy at night- no marijuana- buying OTC   Recommended follow up: Return in about 6 months (around 11/23/2024) for physical or sooner if needed.Schedule b4 you leave.  Lab/Order associations:   ICD-10-CM   1. RLQ abdominal pain  R10.31 Sedimentation rate    C-reactive protein    CT ABDOMEN PELVIS W CONTRAST    2. Attention deficit disorder (ADD) in adult  F98.8 DRUG MONITORING, PANEL 8 WITH CONFIRMATION, URINE    3. White coat syndrome with diagnosis of hypertension  I10     4. Hyperlipidemia,  unspecified hyperlipidemia type  E78.5 Comprehensive metabolic panel with GFR    CBC with Differential/Platelet    Lipid panel    TSH    5. Screening for diabetes mellitus  Z13.1 Hemoglobin A1c    6. Obesity (BMI 30-39.9)  E66.9 Hemoglobin A1c    7. High risk medication use  Z79.899 DRUG MONITORING, PANEL 8 WITH CONFIRMATION, URINE    8. Right lower quadrant abdominal pain  R10.31 CT ABDOMEN PELVIS W CONTRAST      Meds ordered this encounter  Medications   methylphenidate  (RITALIN ) 10 MG tablet    Sig: Take 1 tablet (10 mg total) by mouth 3 (three) times daily with meals. Fill in 1 month    Dispense:  90 tablet    Refill:  0   methylphenidate  (RITALIN ) 10 MG tablet    Sig: Take 1 tablet (10 mg total) by mouth 3 (three) times daily with meals. Fill in 2 months    Dispense:  90 tablet    Refill:  0   methylphenidate  (RITALIN ) 10 MG tablet    Sig: Take 1 tablet (10 mg total) by mouth 3 (three) times daily with meals.    Dispense:  90 tablet    Refill:  0    May fill today.   Abridge software used in note creation with patient consent  Return precautions advised.  Garnette Lukes, MD

## 2024-05-24 ENCOUNTER — Other Ambulatory Visit: Payer: Self-pay

## 2024-05-24 ENCOUNTER — Observation Stay (HOSPITAL_COMMUNITY)
Admission: EM | Admit: 2024-05-24 | Discharge: 2024-05-26 | Disposition: A | Discharge status: Home / Self Care | Attending: General Surgery | Admitting: General Surgery

## 2024-05-24 ENCOUNTER — Encounter (HOSPITAL_COMMUNITY): Payer: Self-pay

## 2024-05-24 DIAGNOSIS — E66811 Obesity, class 1: Secondary | ICD-10-CM | POA: Diagnosis not present

## 2024-05-24 DIAGNOSIS — D6851 Activated protein C resistance: Secondary | ICD-10-CM | POA: Diagnosis present

## 2024-05-24 DIAGNOSIS — F411 Generalized anxiety disorder: Secondary | ICD-10-CM | POA: Insufficient documentation

## 2024-05-24 DIAGNOSIS — F988 Other specified behavioral and emotional disorders with onset usually occurring in childhood and adolescence: Secondary | ICD-10-CM | POA: Diagnosis not present

## 2024-05-24 DIAGNOSIS — J45909 Unspecified asthma, uncomplicated: Secondary | ICD-10-CM | POA: Diagnosis not present

## 2024-05-24 DIAGNOSIS — D6859 Other primary thrombophilia: Secondary | ICD-10-CM | POA: Diagnosis not present

## 2024-05-24 DIAGNOSIS — R1031 Right lower quadrant pain: Secondary | ICD-10-CM | POA: Diagnosis present

## 2024-05-24 DIAGNOSIS — K529 Noninfective gastroenteritis and colitis, unspecified: Secondary | ICD-10-CM | POA: Diagnosis not present

## 2024-05-24 DIAGNOSIS — F900 Attention-deficit hyperactivity disorder, predominantly inattentive type: Secondary | ICD-10-CM | POA: Insufficient documentation

## 2024-05-24 DIAGNOSIS — Z6830 Body mass index (BMI) 30.0-30.9, adult: Secondary | ICD-10-CM | POA: Diagnosis not present

## 2024-05-24 DIAGNOSIS — K3580 Unspecified acute appendicitis: Principal | ICD-10-CM | POA: Insufficient documentation

## 2024-05-24 DIAGNOSIS — K5190 Ulcerative colitis, unspecified, without complications: Secondary | ICD-10-CM | POA: Diagnosis not present

## 2024-05-24 DIAGNOSIS — E785 Hyperlipidemia, unspecified: Secondary | ICD-10-CM | POA: Diagnosis not present

## 2024-05-24 LAB — CBC WITH DIFFERENTIAL/PLATELET
Abs Immature Granulocytes: 0.02 K/uL (ref 0.00–0.07)
Basophils Absolute: 0 K/uL (ref 0.0–0.1)
Basophils Relative: 0 %
Eosinophils Absolute: 0.1 K/uL (ref 0.0–0.5)
Eosinophils Relative: 1 %
HCT: 37.8 % — ABNORMAL LOW (ref 39.0–52.0)
Hemoglobin: 13.2 g/dL (ref 13.0–17.0)
Immature Granulocytes: 0 %
Lymphocytes Relative: 38 %
Lymphs Abs: 2.6 K/uL (ref 0.7–4.0)
MCH: 31.5 pg (ref 26.0–34.0)
MCHC: 34.9 g/dL (ref 30.0–36.0)
MCV: 90.2 fL (ref 80.0–100.0)
Monocytes Absolute: 0.9 K/uL (ref 0.1–1.0)
Monocytes Relative: 13 %
Neutro Abs: 3.2 K/uL (ref 1.7–7.7)
Neutrophils Relative %: 48 %
Platelets: 410 K/uL — ABNORMAL HIGH (ref 150–400)
RBC: 4.19 MIL/uL — ABNORMAL LOW (ref 4.22–5.81)
RDW: 12.5 % (ref 11.5–15.5)
WBC: 6.8 K/uL (ref 4.0–10.5)
nRBC: 0 % (ref 0.0–0.2)

## 2024-05-24 LAB — COMPREHENSIVE METABOLIC PANEL WITH GFR
ALT: 36 U/L (ref 0–44)
AST: 24 U/L (ref 15–41)
Albumin: 3.9 g/dL (ref 3.5–5.0)
Alkaline Phosphatase: 78 U/L (ref 38–126)
Anion gap: 9 (ref 5–15)
BUN: 13 mg/dL (ref 6–20)
CO2: 26 mmol/L (ref 22–32)
Calcium: 9.6 mg/dL (ref 8.9–10.3)
Chloride: 101 mmol/L (ref 98–111)
Creatinine, Ser: 1.15 mg/dL (ref 0.61–1.24)
GFR, Estimated: >60 mL/min (ref >60)
Glucose, Bld: 94 mg/dL (ref 70–99)
Potassium: 3.6 mmol/L (ref 3.5–5.1)
Sodium: 136 mmol/L (ref 135–145)
Total Bilirubin: 0.6 mg/dL (ref 0.0–1.2)
Total Protein: 8 g/dL (ref 6.5–8.1)

## 2024-05-24 LAB — TYPE AND SCREEN
ABO/RH(D): O POS
Antibody Screen: NEGATIVE

## 2024-05-24 LAB — HIV ANTIBODY (ROUTINE TESTING W REFLEX): HIV Screen 4th Generation wRfx: NONREACTIVE

## 2024-05-24 MED ORDER — SODIUM CHLORIDE 0.9% FLUSH
3.0000 mL | Freq: Two times a day (BID) | INTRAVENOUS | Status: DC
  Administered 2024-05-24 – 2024-05-25 (×2): 3 mL via INTRAVENOUS

## 2024-05-24 MED ORDER — LOSARTAN POTASSIUM-HCTZ 100-25 MG PO TABS: 1.0000 | ORAL_TABLET | Freq: Every day | ORAL | Status: DC

## 2024-05-24 MED ORDER — METRONIDAZOLE 500 MG/100ML IV SOLN
500.0000 mg | Freq: Once | INTRAVENOUS | Status: AC
  Administered 2024-05-24: 500 mg via INTRAVENOUS
  Filled 2024-05-24: qty 100

## 2024-05-24 MED ORDER — HYDROCHLOROTHIAZIDE 25 MG PO TABS
25.0000 mg | ORAL_TABLET | Freq: Every day | ORAL | Status: DC
  Administered 2024-05-26: 25 mg via ORAL
  Filled 2024-05-24 (×2): qty 1

## 2024-05-24 MED ORDER — METRONIDAZOLE 500 MG/100ML IV SOLN
500.0000 mg | Freq: Two times a day (BID) | INTRAVENOUS | Status: DC
  Administered 2024-05-24 – 2024-05-26 (×4): 500 mg via INTRAVENOUS
  Filled 2024-05-24 (×4): qty 100

## 2024-05-24 MED ORDER — CIPROFLOXACIN IN D5W 400 MG/200ML IV SOLN
400.0000 mg | Freq: Two times a day (BID) | INTRAVENOUS | Status: DC
  Administered 2024-05-24 – 2024-05-25 (×3): 400 mg via INTRAVENOUS
  Filled 2024-05-24 (×4): qty 200

## 2024-05-24 MED ORDER — LOSARTAN POTASSIUM 50 MG PO TABS
100.0000 mg | ORAL_TABLET | Freq: Every day | ORAL | Status: DC
  Administered 2024-05-25 – 2024-05-26 (×2): 100 mg via ORAL
  Filled 2024-05-24 (×2): qty 2

## 2024-05-24 MED ORDER — ONDANSETRON HCL 4 MG/2ML IJ SOLN
4.0000 mg | Freq: Once | INTRAMUSCULAR | Status: AC
  Administered 2024-05-24: 4 mg via INTRAVENOUS
  Filled 2024-05-24 (×2): qty 2

## 2024-05-24 MED ORDER — LORAZEPAM 0.5 MG PO TABS
0.5000 mg | ORAL_TABLET | Freq: Once | ORAL | Status: AC
  Administered 2024-05-24: 0.5 mg via ORAL
  Filled 2024-05-24: qty 1

## 2024-05-24 MED ORDER — LACTATED RINGERS IV SOLN: INTRAVENOUS | Status: AC

## 2024-05-24 MED ORDER — CIPROFLOXACIN IN D5W 400 MG/200ML IV SOLN
400.0000 mg | Freq: Once | INTRAVENOUS | Status: AC
  Administered 2024-05-24: 400 mg via INTRAVENOUS
  Filled 2024-05-24: qty 200

## 2024-05-24 MED ORDER — SODIUM CHLORIDE 0.9 % IV BOLUS
1000.0000 mL | Freq: Once | INTRAVENOUS | Status: AC
  Administered 2024-05-24: 1000 mL via INTRAVENOUS

## 2024-05-24 MED ORDER — HYDROXYZINE HCL 25 MG PO TABS
25.0000 mg | ORAL_TABLET | Freq: Three times a day (TID) | ORAL | Status: DC | PRN
  Administered 2024-05-24 – 2024-05-25 (×2): 25 mg via ORAL
  Filled 2024-05-24 (×2): qty 1

## 2024-05-24 MED ORDER — METHYLPHENIDATE HCL 5 MG PO TABS
10.0000 mg | ORAL_TABLET | Freq: Three times a day (TID) | ORAL | Status: DC
  Filled 2024-05-24: qty 2

## 2024-05-24 MED ORDER — HYDROMORPHONE HCL 1 MG/ML IJ SOLN
0.5000 mg | INTRAMUSCULAR | Status: DC | PRN
  Administered 2024-05-24 – 2024-05-25 (×2): 1 mg via INTRAVENOUS
  Filled 2024-05-24 (×2): qty 1

## 2024-05-24 MED ORDER — HYDROCODONE-ACETAMINOPHEN 5-325 MG PO TABS
1.0000 | ORAL_TABLET | ORAL | Status: DC | PRN
  Administered 2024-05-24 – 2024-05-25 (×3): 1 via ORAL
  Filled 2024-05-24 (×3): qty 1

## 2024-05-24 MED ORDER — ACETAMINOPHEN 325 MG PO TABS
650.0000 mg | ORAL_TABLET | Freq: Four times a day (QID) | ORAL | Status: DC | PRN
Start: 2024-05-24 — End: 2024-05-25

## 2024-05-24 MED ORDER — MORPHINE SULFATE (PF) 4 MG/ML IV SOLN
4.0000 mg | Freq: Once | INTRAVENOUS | Status: DC
  Filled 2024-05-24: qty 1

## 2024-05-24 MED ORDER — ESCITALOPRAM OXALATE 10 MG PO TABS
15.0000 mg | ORAL_TABLET | Freq: Every day | ORAL | Status: DC
  Administered 2024-05-25 – 2024-05-26 (×2): 15 mg via ORAL
  Filled 2024-05-24 (×2): qty 2

## 2024-05-24 MED ORDER — BUSPIRONE HCL 10 MG PO TABS
10.0000 mg | ORAL_TABLET | Freq: Two times a day (BID) | ORAL | Status: DC
  Administered 2024-05-24 – 2024-05-26 (×4): 10 mg via ORAL
  Filled 2024-05-24 (×4): qty 1

## 2024-05-24 MED ORDER — ACETAMINOPHEN 650 MG RE SUPP
650.0000 mg | Freq: Four times a day (QID) | RECTAL | Status: DC | PRN
Start: 2024-05-24 — End: 2024-05-25

## 2024-05-24 NOTE — ED Triage Notes (Signed)
 Patient sent by Dr Althia from [facility] for CT positive for appy. Patient ambulatory but reports 6-7/10 pain at this time.

## 2024-05-24 NOTE — H&P (Signed)
 H&P Note  Benjamin [name] 10-16-1990  [id number].    Requesting MD: Jayson Pereyra, DO  Chief Complaint/Reason for Consult:  Possible appendicitis vs Colitis  HPI:  Patient is a 33 year old male with PMH significant for Hx of UC, HLD, ADHD, anxiety, depression, and Factor V Leiden not on anticoagulation who presented to the ED with abdominal pain. Pain started 2 days ago and localized to RLQ and he underwent outpatient CT that questioned concern for early appendicitis. He did note that pain is different from baseline abdominal pain. Associated nausea and subjective fever. He was referred to the ED for general surgery evaluation. Per PCP note yesterday he is not on medications for UC (diagnosed in 2020) and has not been followed by GI for years. He reported that he does have an appointment scheduled for 9/3 with [facility].  His UC episodes usually present with abdominal cramps and bloody stools. Has months of loose stools with some bloody stools earlier this summer. Poor appetite over the last 6 months. Patient reported to PCP he uses THC gummies for sleep and drinks 10-15 alcoholic beverages per week. Reports using Zyn pouches once per week.  Reports allergy to penicillin drugs. Had hives after receiving this medication as a child.   ROS: All negative with the exception of above.  Family History  Problem Relation Age of Onset   Hypertension Mother        Has CAD   CAD Mother    Hypertension Father    Alcoholism Father    Alcoholism Paternal Grandfather     Past Medical History:  Diagnosis Date   ADHD    Allergy    Anxiety    Asthma    only before sports   Depression    Factor 5 Leiden mutation, heterozygous [facility])     Past Surgical History:  Procedure Laterality Date   none      Social History:  reports that he has never smoked. He has never used smokeless tobacco. He reports current alcohol use of about 14.0 standard drinks of alcohol per week. He reports  that he does not use drugs.  Allergies:  Allergies  Allergen Reactions   Penicillins     (Not in a hospital admission)   Blood pressure 139/85, pulse 100, temperature 97.9 F (36.6 C), resp. rate 18, height 5' 10 (1.778 m), weight 96.4 kg, SpO2 98%. Physical Exam:  General: Pleasant male who is laying in bed in NAD. HEENT: Head is normocephalic, atraumatic.  Sclera are noninjected. Conjunctiva anicteric. Heart: Regular rate Lungs: Respiratory effort nonlabored Abd: Soft with mild distention. Tenderness to palpation of RLQ and umbilical region. No rebound tenderness or guarding. Psych: A&Ox3 with an appropriate affect.   Results for orders placed or performed during the hospital encounter of 05/24/24 (from the past 48 hours)  CBC with Differential     Status: Abnormal   Collection Time: 05/24/24 12:35 PM  Result Value Ref Range   WBC 6.8 4.0 - 10.5 K/uL   RBC 4.19 (L) 4.22 - 5.81 MIL/uL   Hemoglobin 13.2 13.0 - 17.0 g/dL   HCT 62.1 (L) 60.9 - 47.9 %   MCV 90.2 80.0 - 100.0 fL   MCH 31.5 26.0 - 34.0 pg   MCHC 34.9 30.0 - 36.0 g/dL   RDW 87.4 88.4 - 84.4 %   Platelets 410 (H) 150 - 400 K/uL   nRBC 0.0 0.0 - 0.2 %   Neutrophils Relative %  48 %   Neutro Abs 3.2 1.7 - 7.7 K/uL   Lymphocytes Relative 38 %   Lymphs Abs 2.6 0.7 - 4.0 K/uL   Monocytes Relative 13 %   Monocytes Absolute 0.9 0.1 - 1.0 K/uL   Eosinophils Relative 1 %   Eosinophils Absolute 0.1 0.0 - 0.5 K/uL   Basophils Relative 0 %   Basophils Absolute 0.0 0.0 - 0.1 K/uL   Immature Granulocytes 0 %   Abs Immature Granulocytes 0.02 0.00 - 0.07 K/uL    Comment: Performed at [facility], [address], [place], [place] [address]   CT ABDOMEN PELVIS W CONTRAST Result Date: 05/23/2024 CLINICAL DATA:  Right lower quadrant pain skip EXAM: CT ABDOMEN AND PELVIS WITH CONTRAST TECHNIQUE: Multidetector CT imaging of the abdomen and pelvis was performed using the standard protocol following bolus administration of  intravenous contrast. RADIATION DOSE REDUCTION: This exam was performed according to the departmental dose-optimization program which includes automated exposure control, adjustment of the mA and/or kV according to patient size and/or use of iterative reconstruction technique. CONTRAST:  OMNIPAQUE  IOHEXOL  300 MG/ML  SOLN COMPARISON:  CT abdomen and pelvis 01/06/2019 FINDINGS: Lower chest: There is a calcified granuloma in the right lower lobe. The lungs are otherwise clear. Hepatobiliary: No focal liver abnormality is seen. No gallstones, gallbladder wall thickening, or biliary dilatation. Pancreas: Unremarkable. No pancreatic ductal dilatation or surrounding inflammatory changes. Spleen: Normal in size without focal abnormality. Adrenals/Urinary Tract: Adrenal glands are unremarkable. Kidneys are normal, without renal calculi, focal lesion, or hydronephrosis. Bladder is unremarkable. Stomach/Bowel: There is diffuse colonic wall thickening with trace surrounding inflammation worrisome for nonspecific colitis. The appendix is dilated measuring 1 cm there is trace surrounding inflammation. No evidence for perforation or abscess. No bowel obstruction, pneumatosis or free air. Vascular/Lymphatic: No significant vascular findings are present. No enlarged abdominal or pelvic lymph nodes. There are nonenlarged right lower quadrant mesenteric lymph nodes. Reproductive: Prostate is unremarkable. Other: No abdominal wall hernia or abnormality. No abdominopelvic ascites. Musculoskeletal: No fracture is seen. IMPRESSION: 1. Diffuse colonic wall thickening with trace surrounding inflammation worrisome for nonspecific colitis. 2. The appendix is dilated measuring 1 cm with trace surrounding inflammation worrisome for early acute appendicitis. No evidence for perforation or abscess. Electronically Signed   By: Greig Pique M.D.   On: 05/23/2024 15:50      Assessment/Plan Hx of UC not on meds Colitis vs acute  appendicitis  - CT today: diffuse colonic wall thickening and trace surrounding fluid, dilated appendix to 1 cm with trace surrounding inflammation, no perforation or abscess noted - No leukocytosis, afebrile, mildly tachycardic - Seems more consistent with his ulcerative colitis, recommend medical admission and bowel rest and abx. No surgical intervention recommended at this time. Consider GI consult as well in setting of untreated UC.   I reviewed prior medical encounters, ED provider notes, last 24 h vitals and pain scores, last 48 h intake and output, last 24 h labs and trends, and last 24 h imaging results.  This care required high  level of medical decision making.   Burnard JONELLE Louder, PA-C  [facility] 05/24/2024, 1:32 PM Please see Amion for pager number during day hours 7:00am-4:30pm

## 2024-05-24 NOTE — ED Provider Notes (Addendum)
 [place] [facility] Provider Note  CSN: [id number] Arrival date & time: 05/24/24 1156  Chief Complaint(s) Abdominal Pain  HPI Benjamin [name] is a 33 y.o. male with past medical history as below, significant for ADHD, anxiety, depression, factor V Leyden, HLD who presents to the ED with complaint of abdominal pain  Patient reports abdominal pain over the past couple days, he send right lower quad abdominal pain, poor appetite, nausea without vomiting, chills without fever.  He had outpatient CT of his abdomen pelvis performed yesterday which was concerning for early appendicitis.  Patient was sent to the ER by general surgery for evaluation  Last p.o. intake was around 8 AM this morning, no blood thinners, he is allergic penicillin.  He denies prior abdominal surgery  Past Medical History Past Medical History:  Diagnosis Date   ADHD    Allergy    Anxiety    Asthma    only before sports   Depression    Factor 5 Leiden mutation, heterozygous [facility])    Patient Active Problem List   Diagnosis Date Noted   Hyperlipemia 12/05/2022   Somatic dysfunction of spine, cervical 04/30/2021   Chronic neck pain 01/16/2021   Ulcerative colitis (HCC) 04/07/2019   White coat syndrome with diagnosis of hypertension 07/31/2018   Factor V Leiden mutation (HCC) 11/28/2016   Generalized anxiety disorder 02/20/2013   Attention deficit disorder (ADD) in adult 05/25/2012   Home Medication(s) Prior to Admission medications   Medication Sig Start Date End Date Taking? Authorizing Provider  busPIRone  (BUSPAR ) 10 MG tablet TAKE 1 TABLET BY MOUTH TWICE A DAY 04/21/24   Katrinka Garnette KIDD, MD  escitalopram  (LEXAPRO ) 10 MG tablet TAKE 1 AND 1/2 TABLETS DAILY BY MOUTH 12/10/23   Katrinka Garnette KIDD, MD  hydrOXYzine  (ATARAX ) 25 MG tablet TAKE 1 AND 1/2 TABLETS BY MOUTH EVERY 8 HOURS AS NEEDED 07/01/23   Katrinka Garnette KIDD, MD  losartan -hydrochlorothiazide  (HYZAAR) 100-25 MG  tablet Take 1 tablet by mouth daily. 04/25/24   Lucien Orren SAILOR, PA-C  methylphenidate  (RITALIN ) 10 MG tablet Take 1 tablet (10 mg total) by mouth 3 (three) times daily with meals. Fill in 1 month 05/23/24 06/22/24  Katrinka Garnette KIDD, MD  methylphenidate  (RITALIN ) 10 MG tablet Take 1 tablet (10 mg total) by mouth 3 (three) times daily with meals. Fill in 2 months 05/23/24 06/22/24  Katrinka Garnette KIDD, MD  methylphenidate  (RITALIN ) 10 MG tablet Take 1 tablet (10 mg total) by mouth 3 (three) times daily with meals. 05/23/24   Katrinka Garnette KIDD, MD                                                                                                                                    Past Surgical History Past Surgical History:  Procedure Laterality Date   none     Family History Family History  Problem Relation Age of  Onset   Hypertension Mother        Has CAD   CAD Mother    Hypertension Father    Alcoholism Father    Alcoholism Paternal Grandfather     Social History Social History   Tobacco Use   Smoking status: Never   Smokeless tobacco: Never  Substance Use Topics   Alcohol use: Yes    Alcohol/week: 14.0 standard drinks of alcohol    Types: 14 Standard drinks or equivalent per week    Comment: Daily alcohol   Drug use: No   Allergies Penicillins  Review of Systems A thorough review of systems was obtained and all systems are negative except as noted in the HPI and PMH.   Physical Exam Vital Signs  I have reviewed the triage vital signs BP 117/86 (BP Location: Right Arm)   Pulse 77   Temp (!) 97.4 F (36.3 C) (Oral)   Resp 18   Ht 5' 10 (1.778 m)   Wt 96.4 kg   SpO2 97%   BMI 30.51 kg/m  Physical Exam Vitals and nursing note reviewed.  Constitutional:      General: He is not in acute distress.    Appearance: Normal appearance. He is well-developed. He is not ill-appearing.  HENT:     Head: Normocephalic and atraumatic.     Right Ear: External ear normal.     Left Ear:  External ear normal.     Nose: Nose normal.     Mouth/Throat:     Mouth: Mucous membranes are moist.  Eyes:     General: No scleral icterus.       Right eye: No discharge.        Left eye: No discharge.  Cardiovascular:     Rate and Rhythm: Normal rate.  Pulmonary:     Effort: Pulmonary effort is normal. No respiratory distress.     Breath sounds: No stridor.  Abdominal:     General: Abdomen is flat. There is no distension.     Tenderness: There is abdominal tenderness in the right lower quadrant. There is no guarding. Positive signs include McBurney's sign. Negative signs include Rovsing's sign.  Musculoskeletal:        General: No deformity.     Cervical back: No rigidity.  Skin:    General: Skin is warm and dry.     Coloration: Skin is not cyanotic, jaundiced or pale.  Neurological:     Mental Status: He is alert and oriented to person, place, and time.     GCS: GCS eye subscore is 4. GCS verbal subscore is 5. GCS motor subscore is 6.  Psychiatric:        Speech: Speech normal.        Behavior: Behavior normal. Behavior is cooperative.     ED Results and Treatments Labs (all labs ordered are listed, but only abnormal results are displayed) Labs Reviewed  CBC WITH DIFFERENTIAL/PLATELET - Abnormal; Notable for the following components:      Result Value   RBC 4.19 (*)    HCT 37.8 (*)    Platelets 410 (*)    All other components within normal limits  COMPREHENSIVE METABOLIC PANEL WITH GFR  TYPE AND SCREEN  Radiology No results found.   Pertinent labs & imaging results that were available during my care of the patient were reviewed by me and considered in my medical decision making (see MDM for details).  Medications Ordered in ED Medications  morphine  (PF) 4 MG/ML injection 4 mg (4 mg Intravenous Patient Refused/Not Given 05/24/24 1435)   ondansetron  (ZOFRAN ) injection 4 mg (4 mg Intravenous Patient Refused/Not Given 05/24/24 1435)  ciprofloxacin  (CIPRO ) IVPB 400 mg (0 mg Intravenous Stopped 05/24/24 1429)    And  metroNIDAZOLE  (FLAGYL ) IVPB 500 mg (0 mg Intravenous Stopped 05/24/24 1430)  sodium chloride  0.9 % bolus 1,000 mL (1,000 mLs Intravenous New Bag/Given 05/24/24 1323)                                                                                                                                     Procedures Procedures  (including critical care time)  Medical Decision Making / ED Course    Medical Decision Making:    Andrei Shane Badeaux is a 33 y.o. male with past medical history as below, significant for ADHD, anxiety, depression, factor V Leyden, HLD who presents to the ED with complaint of abdominal pain. The complaint involves an extensive differential diagnosis and also carries with it a high risk of complications and morbidity.  Serious etiology was considered. Ddx includes but is not limited to: Differential diagnosis includes but is not exclusive to acute appendicitis, renal colic, testicular torsion, urinary tract infection, prostatitis,  diverticulitis, small bowel obstruction, colitis, abdominal aortic aneurysm, gastroenteritis, constipation etc.   Complete initial physical exam performed, notably the patient was in no acute distress, HDS.    Reviewed and confirmed nursing documentation for past medical history, family history, social history.  Vital signs reviewed.    Abdominal pain> - Abdominal pain over the past couple days, acute appendicitis noted on imaging. - right lower quadrant abd pain, nonperitoneal.  No febrile, no leukocytosis, pt reports hx colitis but does not follow GI -Reviewed CT from yesterday that shows ?early acute appendicitis, colitis, do not feel need to repeat imaging today - Start Cipro  Flagyl  given penicillin allergy.  NPO.  General surgery consulted  >> UPDATE Spoke  with [facility] PA Marjorie working w/ Dr Polly, do not feel patient's presentation today/imaging is c/w appendicitis, favor more so 2/2 colitis; recommend medical admission w/ GI consult for presumed ulcerative colitis, bowel rest, serial abd exam  Consulted GI, pt d/w [facility] care, [facility] consulted > will see in consult. On recheck pt now remembers that he actually sees [facility], will notify Dr burnette for consult   Recommend admission at this time for ?UC flare, RLQ abd pain, serial abd exam in setting of appendicitis noted on CT, bowel rest. Pt/family are in agreement w/ plan  Admit medicine service Dr Seena  Additional history obtained: -Additional history obtained from family -External records from outside source obtained and reviewed including: Chart review including previous notes, labs, imaging, consultation notes including  Prior imaging, allergies   Lab Tests: -I ordered, reviewed, and interpreted labs.   The pertinent results include:   Labs Reviewed  CBC WITH DIFFERENTIAL/PLATELET - Abnormal; Notable for the following components:      Result Value   RBC 4.19 (*)    HCT 37.8 (*)    Platelets 410 (*)    All other components within normal limits  COMPREHENSIVE METABOLIC PANEL WITH GFR  TYPE AND SCREEN    Notable for labs stable  EKG   EKG Interpretation Date/Time:    Ventricular Rate:    PR Interval:    QRS Duration:    QT Interval:    QTC Calculation:   R Axis:      Text Interpretation:           Imaging Studies ordered: I reviewed CT imaging from yesterday that is revealing for appendicitis  Medicines ordered and prescription drug management: Meds ordered this encounter  Medications   AND Linked Order Group    ciprofloxacin  (CIPRO ) IVPB 400 mg     Antibiotic Indication::   Intra-abdominal Infection    metroNIDAZOLE  (FLAGYL ) IVPB 500 mg     Antibiotic Indication::   Intra-abdominal Infection   sodium chloride  0.9 %  bolus 1,000 mL   morphine  (PF) 4 MG/ML injection 4 mg   ondansetron  (ZOFRAN ) injection 4 mg    -I have reviewed the patients home medicines and have made adjustments as needed   Consultations Obtained: I requested consultation with the general surgery,  and discussed lab and imaging findings as well as pertinent plan   Cardiac Monitoring: Continuous pulse oximetry interpreted by myself, 100% on RA.    Social Determinants of Health:  Diagnosis or treatment significantly limited by social determinants of health: obesity   Reevaluation: After the interventions noted above, I reevaluated the patient and found that they have stayed the same  Co morbidities that complicate the patient evaluation  Past Medical History:  Diagnosis Date   ADHD    Allergy    Anxiety    Asthma    only before sports   Depression    Factor 5 Leiden mutation, heterozygous (HCC)       Dispostion: Disposition decision including need for hospitalization was considered, and patient admitted to the hospital.    Final Clinical Impression(s) / ED Diagnoses Final diagnoses:  Right lower quadrant abdominal pain  Colitis        Elnor Jayson LABOR, DO 05/24/24 1329    Elnor Jayson LABOR, DO 05/24/24 1531    Elnor Jayson LABOR, DO 05/24/24 1617

## 2024-05-24 NOTE — H&P (Addendum)
 History and Physical   Benjamin [name] FMW:[id number] DOB: 12-27-1990 DOA: 05/24/2024  PCP: Katrinka Garnette KIDD, MD   Patient coming from: Home  Chief Complaint: Abdominal pain, abnormal CT  HPI: Benjamin [name] is a 33 y.o. male with medical history significant of hypertension, hyperlipidemia, factor V Leiden, anxiety, ulcerative colitis, ADD presenting with abdominal pain and abnormal CT scan.  Patient has had several days of worsening abdominal pain in the right lower quadrant with some associated nausea or vomiting.  Has also had some decreased appetite for the past 6 months as well as some loose stools and intermittent bloody stools this summer.  Has had bloody stool secondary to ulcerative colitis in the past.  Was seen by PCP and outpatient CT scan has been ordered which showed colitis and concern for early appendicitis.  Sent to the ED for further evaluation.  Denies fevers, chills, chest pain, shortness of breath, constipation, diarrhea.  ED Course: Vital signs in the ED notable for heart rate in the 100s-110s.  Lab workup included CMP within normal limits.  CBC with platelets of 410.  Type and screen performed.  CT abdomen pelvis yesterday showed diffuse colonic wall thickening and trace surrounding inflammation concerning for colitis.  Also noted was a dilated appendix with concern for early acute appendicitis.  Patient received morphine , Cipro , Flagyl , 1 L IV fluids, Zofran  in the ED.  General surgery consulted felt like his appendix changes were most consistent with his ulcerative colitis and not acute appendicitis.  No surgical intervention today.  Will continue to follow.  Recommend admission for observation and GI consultation.    GI has been consulted by EDP.  Review of Systems: As per HPI otherwise all other systems reviewed and are negative.  Past Medical History:  Diagnosis Date   ADHD    Allergy    Anxiety    Asthma    only before sports    Depression    Factor 5 Leiden mutation, heterozygous [facility])     Past Surgical History:  Procedure Laterality Date   none      Social History  reports that he has never smoked. He has never used smokeless tobacco. He reports current alcohol use of about 14.0 standard drinks of alcohol per week. He reports that he does not use drugs.  Allergies  Allergen Reactions   Penicillins     Family History  Problem Relation Age of Onset   Hypertension Mother        Has CAD   CAD Mother    Hypertension Father    Alcoholism Father    Alcoholism Paternal Grandfather   Reviewed on admission  Prior to Admission medications   Medication Sig Start Date End Date Taking? Authorizing Provider  busPIRone  (BUSPAR ) 10 MG tablet TAKE 1 TABLET BY MOUTH TWICE A DAY 04/21/24   Katrinka Garnette KIDD, MD  escitalopram  (LEXAPRO ) 10 MG tablet TAKE 1 AND 1/2 TABLETS DAILY BY MOUTH 12/10/23   Katrinka Garnette KIDD, MD  hydrOXYzine  (ATARAX ) 25 MG tablet TAKE 1 AND 1/2 TABLETS BY MOUTH EVERY 8 HOURS AS NEEDED 07/01/23   Katrinka Garnette KIDD, MD  losartan -hydrochlorothiazide  (HYZAAR) 100-25 MG tablet Take 1 tablet by mouth daily. 04/25/24   Lucien Orren SAILOR, PA-C  methylphenidate  (RITALIN ) 10 MG tablet Take 1 tablet (10 mg total) by mouth 3 (three) times daily with meals. Fill in 1 month 05/23/24 06/22/24  Katrinka Garnette KIDD, MD  methylphenidate  (RITALIN ) 10 MG tablet Take 1 tablet (10 mg total)  by mouth 3 (three) times daily with meals. Fill in 2 months 05/23/24 06/22/24  Katrinka Garnette KIDD, MD  methylphenidate  (RITALIN ) 10 MG tablet Take 1 tablet (10 mg total) by mouth 3 (three) times daily with meals. 05/23/24   Katrinka Garnette KIDD, MD   Physical Exam: Vitals:   05/24/24 1159 05/24/24 1255 05/24/24 1500  BP: 139/85  117/86  Pulse: 100  77  Resp: 18  18  Temp: 97.9 F (36.6 C)  (!) 97.4 F (36.3 C)  TempSrc:   Oral  SpO2: 98%  97%  Weight:  96.4 kg   Height:  5' 10 (1.778 m)     Physical Exam Constitutional:      General: He  is not in acute distress.    Appearance: Normal appearance.  HENT:     Head: Normocephalic and atraumatic.     Mouth/Throat:     Mouth: Mucous membranes are moist.     Pharynx: Oropharynx is clear.  Eyes:     Extraocular Movements: Extraocular movements intact.     Pupils: Pupils are equal, round, and reactive to light.  Cardiovascular:     Rate and Rhythm: Normal rate and regular rhythm.     Pulses: Normal pulses.     Heart sounds: Normal heart sounds.  Pulmonary:     Effort: Pulmonary effort is normal. No respiratory distress.     Breath sounds: Normal breath sounds.  Abdominal:     General: Bowel sounds are normal. There is no distension.     Palpations: Abdomen is soft.     Tenderness: There is abdominal tenderness.  Musculoskeletal:        General: No swelling or deformity.  Skin:    General: Skin is warm and dry.  Neurological:     General: No focal deficit present.     Mental Status: Mental status is at baseline.    Labs on Admission: I have personally reviewed following labs and imaging studies  CBC: Recent Labs  Lab 05/23/24 1101 05/24/24 1235  WBC 7.0 6.8  NEUTROABS 4.2 3.2  HGB 13.5 13.2  HCT 38.8* 37.8*  MCV 90.1 90.2  PLT 381.0 410*    Basic Metabolic Panel: Recent Labs  Lab 05/23/24 1101 05/24/24 1235  NA 136 136  K 3.4* 3.6  CL 99 101  CO2 28 26  GLUCOSE 82 94  BUN 14 13  CREATININE 1.10 1.15  CALCIUM 9.5 9.6    GFR: Estimated Creatinine Clearance: 107.5 mL/min (by C-G formula based on SCr of 1.15 mg/dL).  Liver Function Tests: Recent Labs  Lab 05/23/24 1101 05/24/24 1235  AST 19 24  ALT 33 36  ALKPHOS 91 78  BILITOT 0.5 0.6  PROT 7.8 8.0  ALBUMIN 4.5 3.9    Urine analysis: No results found for: COLORURINE, APPEARANCEUR, LABSPEC, PHURINE, GLUCOSEU, HGBUR, BILIRUBINUR, KETONESUR, PROTEINUR, UROBILINOGEN, NITRITE, LEUKOCYTESUR  Radiological Exams on Admission: CT ABDOMEN PELVIS W CONTRAST Result  Date: 05/23/2024 CLINICAL DATA:  Right lower quadrant pain skip EXAM: CT ABDOMEN AND PELVIS WITH CONTRAST TECHNIQUE: Multidetector CT imaging of the abdomen and pelvis was performed using the standard protocol following bolus administration of intravenous contrast. RADIATION DOSE REDUCTION: This exam was performed according to the departmental dose-optimization program which includes automated exposure control, adjustment of the mA and/or kV according to patient size and/or use of iterative reconstruction technique. CONTRAST:  OMNIPAQUE  IOHEXOL  300 MG/ML  SOLN COMPARISON:  CT abdomen and pelvis 01/06/2019 FINDINGS: Lower chest: There is a calcified  granuloma in the right lower lobe. The lungs are otherwise clear. Hepatobiliary: No focal liver abnormality is seen. No gallstones, gallbladder wall thickening, or biliary dilatation. Pancreas: Unremarkable. No pancreatic ductal dilatation or surrounding inflammatory changes. Spleen: Normal in size without focal abnormality. Adrenals/Urinary Tract: Adrenal glands are unremarkable. Kidneys are normal, without renal calculi, focal lesion, or hydronephrosis. Bladder is unremarkable. Stomach/Bowel: There is diffuse colonic wall thickening with trace surrounding inflammation worrisome for nonspecific colitis. The appendix is dilated measuring 1 cm there is trace surrounding inflammation. No evidence for perforation or abscess. No bowel obstruction, pneumatosis or free air. Vascular/Lymphatic: No significant vascular findings are present. No enlarged abdominal or pelvic lymph nodes. There are nonenlarged right lower quadrant mesenteric lymph nodes. Reproductive: Prostate is unremarkable. Other: No abdominal wall hernia or abnormality. No abdominopelvic ascites. Musculoskeletal: No fracture is seen. IMPRESSION: 1. Diffuse colonic wall thickening with trace surrounding inflammation worrisome for nonspecific colitis. 2. The appendix is dilated measuring 1 cm with trace  surrounding inflammation worrisome for early acute appendicitis. No evidence for perforation or abscess. Electronically Signed   By: Greig Pique M.D.   On: 05/23/2024 15:50   EKG: Not performed the emergency department.  Assessment/Plan Active Problems:   Attention deficit disorder (ADD) in adult   Generalized anxiety disorder   Factor V Leiden mutation (HCC)   Ulcerative colitis (HCC)   Hyperlipemia   Ulcerative colitis Rule out appendicitis > Patient presenting with ongoing abdominal pain and recent CT scan showing diffuse colitis that was nonspecific in the setting of known ulcerative colitis not on medication.  CT also showed changes concerning for appendicitis. > General Surgery has seen the patient feel appendicitis is more likely related to his ulcerative colitis and that he does not need acute intervention. They recommended overnight observation, GI consult and antibiotics. They will follow. > GI has been consulted.  Patient has received Cipro , Flagyl , morphine , 1 L IV fluids in the ED. - Monitor on telemetry overnight - Appreciate general surgery and gastroenterology recommendations and assistance - Continue with ciprofloxacin  and Flagyl  for now - Will await GI recommendations for regimen for UC - Advance diet as tolerated - Supportive care  ADD - Okay to resume Ritalin   Hypertension - Continue losartan -hydrochlorothiazide   Hyperlipidemia - Not a medication for this  Anxiety - Continue home BuSpar , Lexapro   DVT prophylaxis: SCDs Code Status:   Full Family Communication:  None on admission  Disposition Plan:   Patient is from:  Home  Anticipated DC to:  Home  Anticipated DC date:  1 to 2 days  Anticipated DC barriers: None  Consults called:  General surgery, gastroenterology Admission status:  Observation, MedSurg  Severity of Illness: The appropriate patient status for this patient is OBSERVATION. Observation status is judged to be reasonable and necessary  in order to provide the required intensity of service to ensure the patient's safety. The patient's presenting symptoms, physical exam findings, and initial radiographic and laboratory data in the context of their medical condition is felt to place them at decreased risk for further clinical deterioration. Furthermore, it is anticipated that the patient will be medically stable for discharge from the hospital within 2 midnights of admission.    Marsa KATHEE Scurry MD [facility]  How to contact the [facility] Attending or Consulting provider 7A - 7P or covering provider during after hours 7P -7A, for this patient?   Check the care team in [facility] and look for a) attending/consulting [facility] provider listed and b) the [facility] team  listed Log into [email or web address] and use [facility]'s universal password to access. If you do not have the password, please contact the hospital operator. Locate the [facility] provider you are looking for under [facility] and page to a number that you can be directly reached. If you still have difficulty reaching the provider, please page the [facility] (Director on Call) for the Hospitalists listed on amion for assistance.  05/24/2024, 3:20 PM

## 2024-05-24 NOTE — Plan of Care (Signed)

## 2024-05-25 ENCOUNTER — Observation Stay (HOSPITAL_COMMUNITY): Admitting: Anesthesiology

## 2024-05-25 ENCOUNTER — Encounter (HOSPITAL_COMMUNITY)
Admission: EM | Disposition: A | Discharge status: Home / Self Care | Payer: Self-pay | Source: Home / Self Care | Attending: Emergency Medicine

## 2024-05-25 ENCOUNTER — Encounter (HOSPITAL_COMMUNITY): Payer: Self-pay | Admitting: Internal Medicine

## 2024-05-25 ENCOUNTER — Other Ambulatory Visit: Payer: Self-pay

## 2024-05-25 ENCOUNTER — Observation Stay (HOSPITAL_COMMUNITY)

## 2024-05-25 ENCOUNTER — Observation Stay (HOSPITAL_BASED_OUTPATIENT_CLINIC_OR_DEPARTMENT_OTHER): Admitting: Anesthesiology

## 2024-05-25 DIAGNOSIS — K37 Unspecified appendicitis: Secondary | ICD-10-CM | POA: Diagnosis not present

## 2024-05-25 DIAGNOSIS — K529 Noninfective gastroenteritis and colitis, unspecified: Secondary | ICD-10-CM | POA: Diagnosis not present

## 2024-05-25 HISTORY — PX: LAPAROSCOPIC APPENDECTOMY: SHX408

## 2024-05-25 LAB — DRUG MONITORING, PANEL 8 WITH CONFIRMATION, URINE
6 Acetylmorphine: NEGATIVE ng/mL (ref <10)
Alcohol Metabolites: POSITIVE ng/mL — AB (ref <500)
Amphetamines: NEGATIVE ng/mL (ref <500)
Benzodiazepines: NEGATIVE ng/mL (ref <100)
Buprenorphine, Urine: NEGATIVE ng/mL (ref <5)
Cocaine Metabolite: NEGATIVE ng/mL (ref <150)
Creatinine: 206.2 mg/dL (ref >=20.0)
Ethyl Glucuronide (ETG): 896 ng/mL — ABNORMAL HIGH (ref <500)
Ethyl Sulfate (ETS): 548 ng/mL — ABNORMAL HIGH (ref <100)
MDMA: NEGATIVE ng/mL (ref <500)
Marijuana Metabolite: 181 ng/mL — ABNORMAL HIGH (ref <5)
Marijuana Metabolite: POSITIVE ng/mL — AB (ref <20)
Opiates: NEGATIVE ng/mL (ref <100)
Oxidant: NEGATIVE ug/mL (ref <200)
Oxycodone: NEGATIVE ng/mL (ref <100)
pH: 5.5 (ref 4.5–9.0)

## 2024-05-25 LAB — COMPREHENSIVE METABOLIC PANEL WITH GFR
ALT: 33 U/L (ref 0–44)
AST: 22 U/L (ref 15–41)
Albumin: 3.3 g/dL — ABNORMAL LOW (ref 3.5–5.0)
Alkaline Phosphatase: 65 U/L (ref 38–126)
Anion gap: 9 (ref 5–15)
BUN: 12 mg/dL (ref 6–20)
CO2: 29 mmol/L (ref 22–32)
Calcium: 9.1 mg/dL (ref 8.9–10.3)
Chloride: 100 mmol/L (ref 98–111)
Creatinine, Ser: 1.23 mg/dL (ref 0.61–1.24)
GFR, Estimated: >60 mL/min (ref >60)
Glucose, Bld: 90 mg/dL (ref 70–99)
Potassium: 3.9 mmol/L (ref 3.5–5.1)
Sodium: 138 mmol/L (ref 135–145)
Total Bilirubin: 0.6 mg/dL (ref 0.0–1.2)
Total Protein: 6.6 g/dL (ref 6.5–8.1)

## 2024-05-25 LAB — CBC
HCT: 34.2 % — ABNORMAL LOW (ref 39.0–52.0)
Hemoglobin: 11.9 g/dL — ABNORMAL LOW (ref 13.0–17.0)
MCH: 31.3 pg (ref 26.0–34.0)
MCHC: 34.8 g/dL (ref 30.0–36.0)
MCV: 90 fL (ref 80.0–100.0)
Platelets: 332 K/uL (ref 150–400)
RBC: 3.8 MIL/uL — ABNORMAL LOW (ref 4.22–5.81)
RDW: 12.6 % (ref 11.5–15.5)
WBC: 5.9 K/uL (ref 4.0–10.5)
nRBC: 0 % (ref 0.0–0.2)

## 2024-05-25 LAB — DM TEMPLATE

## 2024-05-25 SURGERY — APPENDECTOMY, LAPAROSCOPIC
Anesthesia: General | Site: Abdomen

## 2024-05-25 MED ORDER — LIDOCAINE 2% (20 MG/ML) 5 ML SYRINGE
INTRAMUSCULAR | Status: DC | PRN
  Administered 2024-05-25: 100 mg via INTRAVENOUS

## 2024-05-25 MED ORDER — DEXMEDETOMIDINE HCL IN NACL 80 MCG/20ML IV SOLN
INTRAVENOUS | Status: DC | PRN
  Administered 2024-05-25: 20 ug via INTRAVENOUS

## 2024-05-25 MED ORDER — MIDAZOLAM HCL 2 MG/2ML IJ SOLN
INTRAMUSCULAR | Status: AC
  Filled 2024-05-25: qty 2

## 2024-05-25 MED ORDER — OXYCODONE HCL 5 MG PO TABS: 5.0000 mg | ORAL_TABLET | Freq: Once | ORAL | Status: DC | PRN

## 2024-05-25 MED ORDER — FENTANYL CITRATE (PF) 100 MCG/2ML IJ SOLN
INTRAMUSCULAR | Status: AC
  Filled 2024-05-25: qty 2

## 2024-05-25 MED ORDER — PROPOFOL 10 MG/ML IV BOLUS
INTRAVENOUS | Status: AC
  Filled 2024-05-25: qty 20

## 2024-05-25 MED ORDER — FENTANYL CITRATE (PF) 250 MCG/5ML IJ SOLN
INTRAMUSCULAR | Status: AC
  Filled 2024-05-25: qty 5

## 2024-05-25 MED ORDER — LORAZEPAM 0.5 MG PO TABS
0.5000 mg | ORAL_TABLET | Freq: Four times a day (QID) | ORAL | Status: DC | PRN
  Administered 2024-05-25 – 2024-05-26 (×2): 0.5 mg via ORAL
  Filled 2024-05-25 (×2): qty 1

## 2024-05-25 MED ORDER — CHLORHEXIDINE GLUCONATE 0.12 % MT SOLN: 15.0000 mL | Freq: Once | OROMUCOSAL | Status: AC

## 2024-05-25 MED ORDER — HEMOSTATIC AGENTS (NO CHARGE) OPTIME
TOPICAL | Status: DC | PRN
  Administered 2024-05-25: 1 via TOPICAL

## 2024-05-25 MED ORDER — PROPOFOL 10 MG/ML IV BOLUS
INTRAVENOUS | Status: DC | PRN
  Administered 2024-05-25: 200 mg via INTRAVENOUS

## 2024-05-25 MED ORDER — ACETAMINOPHEN 10 MG/ML IV SOLN
1000.0000 mg | Freq: Once | INTRAVENOUS | Status: DC | PRN
Start: 2024-05-25 — End: 2024-05-25

## 2024-05-25 MED ORDER — IOHEXOL 350 MG/ML SOLN
75.0000 mL | Freq: Once | INTRAVENOUS | Status: AC | PRN
  Administered 2024-05-25: 75 mL via INTRAVENOUS

## 2024-05-25 MED ORDER — LACTATED RINGERS IV SOLN: INTRAVENOUS | Status: DC

## 2024-05-25 MED ORDER — BUPIVACAINE-EPINEPHRINE (PF) 0.25% -1:200000 IJ SOLN
INTRAMUSCULAR | Status: AC
  Filled 2024-05-25: qty 30

## 2024-05-25 MED ORDER — CHLORHEXIDINE GLUCONATE 0.12 % MT SOLN
OROMUCOSAL | Status: AC
  Administered 2024-05-25: 15 mL via OROMUCOSAL
  Filled 2024-05-25: qty 15

## 2024-05-25 MED ORDER — MESALAMINE 1.2 G PO TBEC
4.8000 g | DELAYED_RELEASE_TABLET | Freq: Every day | ORAL | Status: DC
  Administered 2024-05-25 – 2024-05-26 (×2): 4.8 g via ORAL
  Filled 2024-05-25 (×2): qty 4

## 2024-05-25 MED ORDER — BUPIVACAINE-EPINEPHRINE 0.25% -1:200000 IJ SOLN
INTRAMUSCULAR | Status: DC | PRN
  Administered 2024-05-25: 24 mL

## 2024-05-25 MED ORDER — 0.9 % SODIUM CHLORIDE (POUR BTL) OPTIME
TOPICAL | Status: DC | PRN
  Administered 2024-05-25: 1000 mL

## 2024-05-25 MED ORDER — DEXMEDETOMIDINE HCL IN NACL 80 MCG/20ML IV SOLN
INTRAVENOUS | Status: AC
  Filled 2024-05-25: qty 20

## 2024-05-25 MED ORDER — SUGAMMADEX SODIUM 200 MG/2ML IV SOLN
INTRAVENOUS | Status: DC | PRN
  Administered 2024-05-25: 200 mg via INTRAVENOUS

## 2024-05-25 MED ORDER — ACETAMINOPHEN 10 MG/ML IV SOLN
INTRAVENOUS | Status: DC | PRN
  Administered 2024-05-25: 1000 mg via INTRAVENOUS

## 2024-05-25 MED ORDER — DEXAMETHASONE SODIUM PHOSPHATE 10 MG/ML IJ SOLN
INTRAMUSCULAR | Status: DC | PRN
  Administered 2024-05-25: 10 mg via INTRAVENOUS

## 2024-05-25 MED ORDER — ROCURONIUM BROMIDE 10 MG/ML (PF) SYRINGE
PREFILLED_SYRINGE | INTRAVENOUS | Status: DC | PRN
  Administered 2024-05-25: 50 mg via INTRAVENOUS

## 2024-05-25 MED ORDER — OXYCODONE HCL 5 MG PO TABS
5.0000 mg | ORAL_TABLET | ORAL | Status: DC | PRN
  Administered 2024-05-25 – 2024-05-26 (×3): 5 mg via ORAL
  Filled 2024-05-25 (×3): qty 1

## 2024-05-25 MED ORDER — FENTANYL CITRATE (PF) 100 MCG/2ML IJ SOLN
25.0000 ug | INTRAMUSCULAR | Status: DC | PRN
  Administered 2024-05-25: 50 ug via INTRAVENOUS

## 2024-05-25 MED ORDER — OXYCODONE HCL 5 MG/5ML PO SOLN: 5.0000 mg | Freq: Once | ORAL | Status: DC | PRN

## 2024-05-25 MED ORDER — FENTANYL CITRATE (PF) 250 MCG/5ML IJ SOLN
INTRAMUSCULAR | Status: DC | PRN
  Administered 2024-05-25 (×5): 50 ug via INTRAVENOUS

## 2024-05-25 MED ORDER — ACETAMINOPHEN 325 MG PO TABS
975.0000 mg | ORAL_TABLET | Freq: Four times a day (QID) | ORAL | Status: DC
  Administered 2024-05-25 – 2024-05-26 (×2): 975 mg via ORAL
  Filled 2024-05-25 (×3): qty 3

## 2024-05-25 MED ORDER — ORAL CARE MOUTH RINSE: 15.0000 mL | Freq: Once | OROMUCOSAL | Status: AC

## 2024-05-25 MED ORDER — HYDROMORPHONE HCL 1 MG/ML IJ SOLN
1.0000 mg | INTRAMUSCULAR | Status: DC | PRN
  Administered 2024-05-25 – 2024-05-26 (×3): 1 mg via INTRAVENOUS
  Filled 2024-05-25 (×4): qty 1

## 2024-05-25 MED ORDER — MIDAZOLAM HCL 2 MG/2ML IJ SOLN
INTRAMUSCULAR | Status: DC | PRN
  Administered 2024-05-25: 2 mg via INTRAVENOUS

## 2024-05-25 MED ORDER — ONDANSETRON HCL 4 MG/2ML IJ SOLN
INTRAMUSCULAR | Status: DC | PRN
  Administered 2024-05-25: 4 mg via INTRAVENOUS

## 2024-05-25 SURGICAL SUPPLY — 42 items
BAG COUNTER SPONGE SURGICOUNT (BAG) ×2 IMPLANT
BLADE CLIPPER SURG (BLADE) IMPLANT
CANISTER SUCTION 3000ML PPV (SUCTIONS) ×2 IMPLANT
CHLORAPREP W/TINT 26 (MISCELLANEOUS) ×2 IMPLANT
CLIP APPLIE 5 13 M/L LIGAMAX5 (MISCELLANEOUS) IMPLANT
COVER SURGICAL LIGHT HANDLE (MISCELLANEOUS) ×2 IMPLANT
CUTTER FLEX LINEAR 45M (STAPLE) IMPLANT
DERMABOND ADVANCED .7 DNX12 (GAUZE/BANDAGES/DRESSINGS) ×2 IMPLANT
DRAPE UTILITY XL STRL (DRAPES) IMPLANT
ELECTRODE REM PT RTRN 9FT ADLT (ELECTROSURGICAL) ×2 IMPLANT
ENDOLOOP SUT PDS II 0 18 (SUTURE) IMPLANT
GLOVE BIO SURGEON STRL SZ7 (GLOVE) ×2 IMPLANT
GOWN STRL REUS W/ TWL LRG LVL3 (GOWN DISPOSABLE) ×2 IMPLANT
GOWN STRL REUS W/ TWL XL LVL3 (GOWN DISPOSABLE) ×2 IMPLANT
GRASPER SUT TROCAR 14GX15 (MISCELLANEOUS) IMPLANT
IRRIGATION SUCT STRKRFLW 2 WTP (MISCELLANEOUS) IMPLANT
KIT BASIN OR (CUSTOM PROCEDURE TRAY) ×2 IMPLANT
KIT TURNOVER KIT B (KITS) ×2 IMPLANT
NDL INSUFFLATION 14GA 120MM (NEEDLE) IMPLANT
NEEDLE INSUFFLATION 14GA 120MM (NEEDLE) ×1 IMPLANT
NS IRRIG 1000ML POUR BTL (IV SOLUTION) ×2 IMPLANT
PAD ARMBOARD POSITIONER FOAM (MISCELLANEOUS) ×4 IMPLANT
PENCIL BUTTON HOLSTER BLD 10FT (ELECTRODE) IMPLANT
POWDER SURGICEL 3.0 GRAM (HEMOSTASIS) IMPLANT
RELOAD STAPLE 45 2.5 WHT GRN (ENDOMECHANICALS) IMPLANT
RELOAD STAPLE 45 3.5 BLU ETS (ENDOMECHANICALS) IMPLANT
SCISSORS LAP 5X35 DISP (ENDOMECHANICALS) IMPLANT
SET TUBE SMOKE EVAC HIGH FLOW (TUBING) ×2 IMPLANT
SHEARS HARMONIC 36 ACE (MISCELLANEOUS) IMPLANT
SLEEVE Z-THREAD 5X100MM (TROCAR) ×2 IMPLANT
SUT MNCRL AB 4-0 PS2 18 (SUTURE) ×2 IMPLANT
SUT VICRYL 0 UR6 27IN ABS (SUTURE) IMPLANT
SYSTEM BAG RETRIEVAL 10MM (BASKET) ×2 IMPLANT
TIP ENDOSCOPIC SURGICEL (TIP) IMPLANT
TOWEL GREEN STERILE (TOWEL DISPOSABLE) ×2 IMPLANT
TOWEL GREEN STERILE FF (TOWEL DISPOSABLE) ×2 IMPLANT
TRAY FOLEY W/BAG SLVR 14FR (SET/KITS/TRAYS/PACK) IMPLANT
TRAY LAPAROSCOPIC MC (CUSTOM PROCEDURE TRAY) ×2 IMPLANT
TROCAR Z THREAD OPTICAL 12X100 (TROCAR) ×2 IMPLANT
TROCAR Z-THREAD OPTICAL 5X100M (TROCAR) ×2 IMPLANT
WARMER LAPAROSCOPE (MISCELLANEOUS) ×2 IMPLANT
WATER STERILE IRR 1000ML POUR (IV SOLUTION) ×2 IMPLANT

## 2024-05-25 NOTE — Progress Notes (Addendum)
 Progress Note     Subjective: Patient reports continued tenderness of umbilical region and right lower quadrant. Reports that pain has not worsened. Denies nausea and vomiting. Had a bowel movement. Denies hematochezia. Reports flatulence.  ROS  All negative with the exception of above.  Objective: Vital signs in last 24 hours: Temp:  [97.4 F (36.3 C)-98.5 F (36.9 C)] 98.5 F (36.9 C) (08/20 0756) Pulse Rate:  [68-100] 80 (08/20 0756) Resp:  [15-18] 15 (08/20 0756) BP: (117-145)/(85-97) 145/96 (08/20 0756) SpO2:  [97 %-100 %] 98 % (08/20 0756) Weight:  [96.4 kg] 96.4 kg (08/19 1255) Last BM Date : 05/24/24  Intake/Output from previous day: 08/19 0701 - 08/20 0700 In: 1011.3 [I.V.:412.5; IV Piggyback:598.9] Out: -  Intake/Output this shift: No intake/output data recorded.  PE: General: Pleasant male who is laying in bed in NAD. Lungs: Respiratory effort nonlabored Abd: Soft and ND. Tenderness to palpation of umbilical region and RLQ. No rebound tenderness or guarding. Psych: A&Ox3 with an appropriate affect.    Lab Results:  Recent Labs    05/24/24 1235 05/25/24 0442  WBC 6.8 5.9  HGB 13.2 11.9*  HCT 37.8* 34.2*  PLT 410* 332   BMET Recent Labs    05/24/24 1235 05/25/24 0442  NA 136 138  K 3.6 3.9  CL 101 100  CO2 26 29  GLUCOSE 94 90  BUN 13 12  CREATININE 1.15 1.23  CALCIUM 9.6 9.1   PT/INR No results for input(s): LABPROT, INR in the last 72 hours. CMP     Component Value Date/Time   NA 138 05/25/2024 0442   NA 139 12/12/2022 1505   K 3.9 05/25/2024 0442   CL 100 05/25/2024 0442   CO2 29 05/25/2024 0442   GLUCOSE 90 05/25/2024 0442   BUN 12 05/25/2024 0442   BUN 15 12/12/2022 1505   CREATININE 1.23 05/25/2024 0442   CREATININE 1.10 07/30/2018 1519   CALCIUM 9.1 05/25/2024 0442   PROT 6.6 05/25/2024 0442   ALBUMIN 3.3 (L) 05/25/2024 0442   AST 22 05/25/2024 0442   ALT 33 05/25/2024 0442   ALKPHOS 65 05/25/2024 0442   BILITOT  0.6 05/25/2024 0442   GFRNONAA >60 05/25/2024 0442   Lipase  No results found for: LIPASE     Studies/Results: CT ABDOMEN PELVIS W CONTRAST Result Date: 05/23/2024 CLINICAL DATA:  Right lower quadrant pain skip EXAM: CT ABDOMEN AND PELVIS WITH CONTRAST TECHNIQUE: Multidetector CT imaging of the abdomen and pelvis was performed using the standard protocol following bolus administration of intravenous contrast. RADIATION DOSE REDUCTION: This exam was performed according to the departmental dose-optimization program which includes automated exposure control, adjustment of the mA and/or kV according to patient size and/or use of iterative reconstruction technique. CONTRAST:  OMNIPAQUE  IOHEXOL  300 MG/ML  SOLN COMPARISON:  CT abdomen and pelvis 01/06/2019 FINDINGS: Lower chest: There is a calcified granuloma in the right lower lobe. The lungs are otherwise clear. Hepatobiliary: No focal liver abnormality is seen. No gallstones, gallbladder wall thickening, or biliary dilatation. Pancreas: Unremarkable. No pancreatic ductal dilatation or surrounding inflammatory changes. Spleen: Normal in size without focal abnormality. Adrenals/Urinary Tract: Adrenal glands are unremarkable. Kidneys are normal, without renal calculi, focal lesion, or hydronephrosis. Bladder is unremarkable. Stomach/Bowel: There is diffuse colonic wall thickening with trace surrounding inflammation worrisome for nonspecific colitis. The appendix is dilated measuring 1 cm there is trace surrounding inflammation. No evidence for perforation or abscess. No bowel obstruction, pneumatosis or free air. Vascular/Lymphatic: No  significant vascular findings are present. No enlarged abdominal or pelvic lymph nodes. There are nonenlarged right lower quadrant mesenteric lymph nodes. Reproductive: Prostate is unremarkable. Other: No abdominal wall hernia or abnormality. No abdominopelvic ascites. Musculoskeletal: No fracture is seen. IMPRESSION: 1.  Diffuse colonic wall thickening with trace surrounding inflammation worrisome for nonspecific colitis. 2. The appendix is dilated measuring 1 cm with trace surrounding inflammation worrisome for early acute appendicitis. No evidence for perforation or abscess. Electronically Signed   By: Greig Pique M.D.   On: 05/23/2024 15:50    Anti-infectives: Anti-infectives (From admission, onward)    Start     Dose/Rate Route Frequency Ordered Stop   05/24/24 2200  ciprofloxacin  (CIPRO ) IVPB 400 mg       Placed in And Linked Group   400 mg 200 mL/hr over 60 Minutes Intravenous Every 12 hours 05/24/24 1542     05/24/24 2200  metroNIDAZOLE  (FLAGYL ) IVPB 500 mg       Placed in And Linked Group   500 mg 100 mL/hr over 60 Minutes Intravenous Every 12 hours 05/24/24 1542     05/24/24 1245  ciprofloxacin  (CIPRO ) IVPB 400 mg       Placed in And Linked Group   400 mg 200 mL/hr over 60 Minutes Intravenous  Once 05/24/24 1235 05/24/24 1429   05/24/24 1245  metroNIDAZOLE  (FLAGYL ) IVPB 500 mg       Placed in And Linked Group   500 mg 100 mL/hr over 60 Minutes Intravenous  Once 05/24/24 1235 05/24/24 1430        Assessment/Plan Hx of UC without medical management UC vs acute appendicitis  - CT 8/18 showing diffuse colonic wall thickening and trace surrounding fluid, dilated appendix to 1 cm with trace surrounding inflammation, no perforation or abscess noted. - WBC 5.9 from 6.8. - Mild hypertension. Afebrile. - Continues to have mild pain of RLQ and umbilical region. No worsening symptoms. CT consistent with UC flare. No indication for urgent surgical intervention.  - Recommend GI consult in the setting of untreated UC. - Continue antibiotics per hospital team.  FEN: CLD; IVF per hospital team VTE: SCDs  ID: Ciprofloxacin  and Metronidazole  per hospital team    LOS: 0 days   I reviewed hospitalist notes, last 24 h vitals and pain scores, last 48 h intake and output, last 24 h labs and  trends, and last 24 h imaging results.  This care required moderate level of medical decision making.    Marjorie Carlyon Favre, [facility] Surgery 05/25/2024, 10:02 AM Please see Amion for pager number during day hours 7:00am-4:30pm

## 2024-05-25 NOTE — Care Management Obs Status (Signed)
 MEDICARE OBSERVATION STATUS NOTIFICATION   Patient Details  Name: Benjamin [name] MRN: [id number] Date of Birth: [birth year]   Medicare Observation Status Notification Given:     Obs letter signed and copy given   Claretta Deed 05/25/2024, 11:42 AM

## 2024-05-25 NOTE — Op Note (Signed)
 Preoperative diagnosis: acute appendicitis   Postoperative diagnosis: Same   Procedure: laparoscopic appendectomy  Surgeon: Cordella Idler, M.D.  Asst: None  Anesthesia: General endotracheal  Indications for procedure: Benjamin [name] is a 33 y.o. male with ulcerative colitis off treatment currently.  He presented to the ED with abdominal pain and had a CT scan that showed diffuse colitis as well as concern for appendicitis.  He was initially admitted and managed with antibiotics with partial relief of his symptoms.  He had repeat CT scan on hospital day 2 which showed essentially unchanged inflammation near the appendix.  I offered to take him to the operating room for appendectomy.  All of his questions were addressed and written consent was obtained.  Description of procedure: The patient was brought into the operating room, placed in the supine position. General endotracheal anesthesia was administered with endotracheal tube. The patient's left arm was tucked. All pressure points were offloaded by foam padding. The patient was prepped and draped in the usual sterile fashion.  A veress needle was inserted into the abdomen in the LUQ at Palmer's point. After verifying correct placement with aspiration and a saline drop test, the abdomen was insufflated to .  A 5-mm trocar was inserted into the abdomen just left of the umbilicus using an opti-view technique.  The laparoscope was introduced and the abdominal contents inspected to ensure that there were no signs of injury.  An additional 5-mm port was placed in the suprapubic position and a 12-mm port was placed in the LLQ under direct visualization.  All trocars sites were first anesthesized with 0.25% marcaine  with epinephrine . Next the patient was placed in trendelenberg, rotated to the left. The omentum was retracted cephalad. The cecum and appendix were identified. The appendix was mildly injected. There was no evidence of  perforation. The base of the appendix appeared healthy.  A mesenteric window was created at the base of the appendix using a maryland  foceps. A 45mm white load stapler was used to transect the appendix at the base.  The mesoappendix was taken with another white load from the stapler. The appendix was placed in a specimen bag. The staple line on the appendix was inspected and there appeared to be a slight amount of ooze. This was observed and stopped before intervention. Hemostatic powder was placed over the staple line. The appendix was removed via the LLQ incision. 0 vicryl was used to close the fascial defect from the 12-mm port. All trocars were removed. All incisions were closed with 4-0 monocryl. The patient woke from anesthesia and was brought to PACU in stable condition.  Findings: Mildly injected appendix  Specimen: appendix for permanent  Blood loss: 10mL  Local anesthesia: 20mL 0.25% marcaine  w/ epinephrine   Complications: None  Cordella Idler, M.D. General Surgery [facility] Surgery, [place]

## 2024-05-25 NOTE — Hospital Course (Addendum)
 33 y.o. M with HTN, factor V Leiden, UC who presented with acute RLQ pain.  GI and Gen Surg consulted.

## 2024-05-25 NOTE — Consult Note (Signed)
 [facility] Consultation Note  Referring Provider: [facility] Primary Care Physician:  Katrinka Garnette KIDD, MD Primary Gastroenterologist:  Dr. Rosalie  Reason for Consultation:  abdominal pain  HPI: Benjamin [name] is a 33 y.o. male history ulcerative colitis for years, off treatment for years, with chronic non-bloody diarrhea for years.  Had acute onset RLQ pain two days ago, woke from sleep, without prior similar type symptoms with his colitis.  No blood in stool or fevers.  His pain has improved with antibiotics.   Past Medical History:  Diagnosis Date   ADHD    Allergy    Anxiety    Asthma    only before sports   Depression    Factor 5 Leiden mutation, heterozygous [facility])     Past Surgical History:  Procedure Laterality Date   none      Prior to Admission medications   Medication Sig Start Date End Date Taking? Authorizing Provider  busPIRone  (BUSPAR ) 10 MG tablet TAKE 1 TABLET BY MOUTH TWICE A DAY 04/21/24  Yes Katrinka Garnette KIDD, MD  escitalopram  (LEXAPRO ) 10 MG tablet TAKE 1 AND 1/2 TABLETS DAILY BY MOUTH Patient taking differently: Take 10 mg by mouth daily. 12/10/23  Yes Katrinka Garnette KIDD, MD  hydrOXYzine  (ATARAX ) 25 MG tablet TAKE 1 AND 1/2 TABLETS BY MOUTH EVERY 8 HOURS AS NEEDED Patient taking differently: Take 25 mg by mouth every 6 (six) hours as needed for anxiety. 07/01/23  Yes Katrinka Garnette KIDD, MD  losartan -hydrochlorothiazide  (HYZAAR) 100-25 MG tablet Take 1 tablet by mouth daily. 04/25/24  Yes Conte, Tessa N, PA-C  methylphenidate  (RITALIN ) 10 MG tablet Take 1 tablet (10 mg total) by mouth 3 (three) times daily with meals. Fill in 1 month Patient taking differently: Take 10 mg by mouth 3 (three) times daily with meals. 05/23/24 06/22/24 Yes Katrinka Garnette KIDD, MD    Current Facility-Administered Medications  Medication Dose Route Frequency Provider Last Rate Last Admin   acetaminophen  (TYLENOL ) tablet 650 mg  650 mg Oral Q6H PRN Seena Marsa NOVAK, MD       Or   acetaminophen  (TYLENOL ) suppository 650 mg  650 mg Rectal Q6H PRN Seena Marsa NOVAK, MD       busPIRone  (BUSPAR ) tablet 10 mg  10 mg Oral BID Melvin, Alexander B, MD   10 mg at 05/24/24 2135   ciprofloxacin  (CIPRO ) IVPB 400 mg  400 mg Intravenous Q12H Melvin, Alexander B, MD   Stopped at 05/24/24 2302   And   metroNIDAZOLE  (FLAGYL ) IVPB 500 mg  500 mg Intravenous Q12H Melvin, Alexander B, MD   Stopped at 05/24/24 2306   escitalopram  (LEXAPRO ) tablet 15 mg  15 mg Oral Daily Melvin, Alexander B, MD       losartan  (COZAAR ) tablet 100 mg  100 mg Oral Daily Melvin, Alexander B, MD       And   hydrochlorothiazide  (HYDRODIURIL ) tablet 25 mg  25 mg Oral Daily Melvin, Alexander B, MD       HYDROcodone -acetaminophen  (NORCO/VICODIN) 5-325 MG per tablet 1 tablet  1 tablet Oral Q4H PRN Seena Marsa NOVAK, MD   1 tablet at 05/25/24 0755   HYDROmorphone  (DILAUDID ) injection 0.5-1 mg  0.5-1 mg Intravenous Q4H PRN Melvin, Alexander B, MD   1 mg at 05/25/24 9680   hydrOXYzine  (ATARAX ) tablet 25 mg  25 mg Oral TID PRN Melvin, Alexander B, MD   25 mg at 05/24/24 2135   lactated ringers  infusion   Intravenous Continuous Segars, Dorn,  MD 75 mL/hr at 05/25/24 9662 Infusion Verify at 05/25/24 9662   methylphenidate  (RITALIN ) tablet 10 mg  10 mg Oral TID WC Melvin, Alexander B, MD       morphine  (PF) 4 MG/ML injection 4 mg  4 mg Intravenous Once Gray, Samuel A, DO       sodium chloride  flush (NS) 0.9 % injection 3 mL  3 mL Intravenous Q12H Seena Marsa NOVAK, MD   3 mL at 05/24/24 2135    Allergies as of 05/24/2024 - Review Complete 05/24/2024  Allergen Reaction Noted   Penicillins Hives 05/25/2012    Family History  Problem Relation Age of Onset   Hypertension Mother        Has CAD   CAD Mother    Hypertension Father    Alcoholism Father    Alcoholism Paternal Grandfather     Social History   Socioeconomic History   Marital status: Single    Spouse name: Not on file    Number of children: Not on file   Years of education: Not on file   Highest education level: Not on file  Occupational History   Occupation: Firefighter  Tobacco Use   Smoking status: Never   Smokeless tobacco: Never  Substance and Sexual Activity   Alcohol use: Yes    Alcohol/week: 14.0 standard drinks of alcohol    Types: 14 Standard drinks or equivalent per week    Comment: Daily alcohol   Drug use: No   Sexual activity: Not on file  Other Topics Concern   Not on file  Social History Narrative   Single. Not dating.       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       Hobbies: a lot of working out- 7 days a week, avid Armed forces operational officer, enjoyed lacrosse in the past   Social Drivers of Corporate investment banker Strain: Not on file  Food Insecurity: No Food Insecurity (05/24/2024)   Hunger Vital Sign    Worried About Running Out of Food in the Last Year: Never true    Ran Out of Food in the Last Year: Never true  Transportation Needs: No Transportation Needs (05/24/2024)   PRAPARE - Administrator, Civil Service (Medical): No    Lack of Transportation (Non-Medical): No  Physical Activity: Not on file  Stress: Not on file  Social Connections: Not on file  Intimate Partner Violence: Not At Risk (05/24/2024)   Humiliation, Afraid, Rape, and Kick questionnaire    Fear of Current or Ex-Partner: No    Emotionally Abused: No    Physically Abused: No    Sexually Abused: No    Review of Systems: As per HPI, all others negative  Physical Exam: Vital signs in last 24 hours: Temp:  [97.4 F (36.3 C)-98.5 F (36.9 C)] 98.5 F (36.9 C) (08/20 0756) Pulse Rate:  [68-100] 80 (08/20 0756) Resp:  [15-18] 15 (08/20 0756) BP: (117-145)/(85-97) 145/96 (08/20 0756) SpO2:  [97 %-100 %] 98 % (08/20  0756) Weight:  [96.4 kg] 96.4 kg (08/19 1255) Last BM Date : 05/24/24 General:   Alert,  Well-developed, well-nourished, pleasant and cooperative in NAD Head:  Normocephalic and atraumatic. Eyes:  Sclera clear, no icterus.   Conjunctiva pink. Ears:  Normal auditory acuity. Nose:  No  deformity, discharge,  or lesions. Mouth:  No deformity or lesions.  Oropharynx pink & moist. Neck:  Supple; no masses or thyromegaly. Lungs: No visible distress Abdomen:  Soft, mild focal RLQ tenderness, and nondistended. No masses, hepatosplenomegaly or hernias noted. No guarding and without rebound.     Msk:  Symmetrical without gross deformities. Normal posture. Pulses:  Normal pulses noted. Extremities:  Without clubbing or edema. Neurologic:  Alert and  oriented x4;  grossly normal neurologically. Skin:  Intact without significant lesions or rashes. Psych:  Alert and cooperative. Normal mood and affect.   Lab Results: Recent Labs    05/23/24 1101 05/24/24 1235 05/25/24 0442  WBC 7.0 6.8 5.9  HGB 13.5 13.2 11.9*  HCT 38.8* 37.8* 34.2*  PLT 381.0 410* 332   BMET Recent Labs    05/23/24 1101 05/24/24 1235 05/25/24 0442  NA 136 136 138  K 3.4* 3.6 3.9  CL 99 101 100  CO2 28 26 29   GLUCOSE 82 94 90  BUN 14 13 12   CREATININE 1.10 1.15 1.23  CALCIUM 9.5 9.6 9.1   LFT Recent Labs    05/25/24 0442  PROT 6.6  ALBUMIN 3.3*  AST 22  ALT 33  ALKPHOS 65  BILITOT 0.6   PT/INR No results for input(s): LABPROT, INR in the last 72 hours.  Studies/Results: CT ABDOMEN PELVIS W CONTRAST Result Date: 05/23/2024 CLINICAL DATA:  Right lower quadrant pain skip EXAM: CT ABDOMEN AND PELVIS WITH CONTRAST TECHNIQUE: Multidetector CT imaging of the abdomen and pelvis was performed using the standard protocol following bolus administration of intravenous contrast. RADIATION DOSE REDUCTION: This exam was performed according to the departmental dose-optimization program which includes automated  exposure control, adjustment of the mA and/or kV according to patient size and/or use of iterative reconstruction technique. CONTRAST:  OMNIPAQUE  IOHEXOL  300 MG/ML  SOLN COMPARISON:  CT abdomen and pelvis 01/06/2019 FINDINGS: Lower chest: There is a calcified granuloma in the right lower lobe. The lungs are otherwise clear. Hepatobiliary: No focal liver abnormality is seen. No gallstones, gallbladder wall thickening, or biliary dilatation. Pancreas: Unremarkable. No pancreatic ductal dilatation or surrounding inflammatory changes. Spleen: Normal in size without focal abnormality. Adrenals/Urinary Tract: Adrenal glands are unremarkable. Kidneys are normal, without renal calculi, focal lesion, or hydronephrosis. Bladder is unremarkable. Stomach/Bowel: There is diffuse colonic wall thickening with trace surrounding inflammation worrisome for nonspecific colitis. The appendix is dilated measuring 1 cm there is trace surrounding inflammation. No evidence for perforation or abscess. No bowel obstruction, pneumatosis or free air. Vascular/Lymphatic: No significant vascular findings are present. No enlarged abdominal or pelvic lymph nodes. There are nonenlarged right lower quadrant mesenteric lymph nodes. Reproductive: Prostate is unremarkable. Other: No abdominal wall hernia or abnormality. No abdominopelvic ascites. Musculoskeletal: No fracture is seen. IMPRESSION: 1. Diffuse colonic wall thickening with trace surrounding inflammation worrisome for nonspecific colitis. 2. The appendix is dilated measuring 1 cm with trace surrounding inflammation worrisome for early acute appendicitis. No evidence for perforation or abscess. Electronically Signed   By: Greig Pique M.D.   On: 05/23/2024 15:50    Impression:   Acute onset RLQ pain (awaking patient from sleep) with CT evidence of colitis as well as appendiceal dilatation.  Patient has diarrhea (chronically, years) and history of untreated ulcerative colitis  (years) without similar acute focal RLQ pains.  I suspect patient has mildly active and untreated chronic ulcerative colitis but suspect acute RLQ pain is more likely from appendicitis (appendicitis in turn could be from  swelling/edema in region of cecum but also could be from more typical fecalith).  Plan:   Colonoscopy is contraindicated given concerns (clinically and radiographically) for appendicitis. Surgery pursuing IV antibiotics and non-operative management at this point. I am hesitant to use IV steroids at this juncture given my concern for appendicitis; as such, will treat colitis with mesalamine  at this time. Patient's improvement likely from antibiotics and bowel rest; antibiotics don't appreciably alter course of ulcerative colitis Diet per surgical team. [facility] will follow.   LOS: 0 days   Emanual Lamountain M  05/25/2024, 10:00 AM  Cell [phone] If no answer or after 5 PM call [phone]

## 2024-05-25 NOTE — Progress Notes (Signed)
  Progress Note   Patient: Benjamin [name] FMW:[id number] DOB: Jan 31, 1991 DOA: 05/24/2024     0 DOS: the patient was seen and examined on 05/25/2024 at 12:20PM      Brief hospital course: 33 y.o. M with HTN, factor V Leiden, UC who presented with acute RLQ pain.  GI and Gen Surg consulted.     Assessment and Plan: Abdominal pain  There is some diagnostic uncertainty about the CT findings.  Clinically, new onset of RLQ pain without significant change in bowel frequency, hematochezia militates more for appendicitis than UC flare. - Continue Cipro /Flagyl  - Consult Gen Surg, GI, appreciate cares - Diet per Gen Surg   Ulcerative colitis - Hold steroids - Continue mesalamine    ADD Mood disorder - Continue Lexapro  and Ritalin   - Continue Buspar   Hypertension - Continue losartan -HCTZ         Subjective: Pain improving with antibiotics.  No confusion, no fever.  No vomiting.       Physical Exam: BP 128/89 (BP Location: Right Arm)   Pulse 65   Temp 97.9 F (36.6 C) (Oral)   Resp 19   Ht 5' 10 (1.778 m)   Wt 95.3 kg   SpO2 96%   BMI 30.13 kg/m   Adult male, ambulating in room, no acute distress RRR, no murmurs, no peripheral edema Respiratory rate normal, lungs clear without rales or wheezes Abdomen soft, tenderness at McBurney's point, no rigidity, no rebound pain, no left-sided tenderness Attention normal, affect appropriate, judgment and insight appear normal   Data Reviewed: Basic metabolic panel unremarkable, CBC shows trace anemia    Family Communication: mother at bedside             Author: Lonni SHAUNNA Dalton, MD 05/25/2024 4:39 PM  For on call review [email or web address].

## 2024-05-25 NOTE — TOC CM/SW Note (Signed)
 Transition of Care [facility]) - Inpatient Brief Assessment   Patient Details  Name: Benjamin [name] MRN: [id number] Date of Birth: 1990-12-13  Transition of [facility] CM/SW Contact:    Lauraine FORBES Saa, LCSWA Phone Number: 05/25/2024, 8:56 AM   Clinical Narrative:  8:56 AM Per chart review, patient resides at home alone. Patient has a PCP and insurance. Patient does not have SNF/HH/DME history. Patient's preferred pharmacy is [facility] [address]. No TOC needs were identified at this time. TOC will continue to follow and be available to assist.  Transition of Care Asessment: Insurance and Status: Insurance coverage has been reviewed Patient has primary care physician: Yes Home environment has been reviewed: Private Residence Prior level of function:: N/A Prior/Current Home Services: No current home services Social Drivers of Health Review: SDOH reviewed no interventions necessary Readmission risk has been reviewed: Yes (Currently Yellow 16%) Transition of care needs: no transition of care needs at this time

## 2024-05-25 NOTE — Discharge Instructions (Signed)

## 2024-05-25 NOTE — Anesthesia Procedure Notes (Signed)
 Procedure Name: Intubation Date/Time: 05/25/2024 4:13 PM  Performed by: Mannie Krystal LABOR, CRNAPre-anesthesia Checklist: Patient identified, Emergency Drugs available, Suction available and Patient being monitored Patient Re-evaluated:Patient Re-evaluated prior to induction Oxygen Delivery Method: Circle system utilized Preoxygenation: Pre-oxygenation with 100% oxygen Induction Type: IV induction Ventilation: Mask ventilation without difficulty Laryngoscope Size: Mac and 4 Grade View: Grade I Tube type: Oral Tube size: 7.5 mm Number of attempts: 1 Airway Equipment and Method: Stylet and Oral airway Placement Confirmation: ETT inserted through vocal cords under direct vision, positive ETCO2 and breath sounds checked- equal and bilateral Secured at: 21 cm Tube secured with: Tape Dental Injury: Teeth and Oropharynx as per pre-operative assessment

## 2024-05-25 NOTE — Anesthesia Preprocedure Evaluation (Signed)
 Anesthesia Evaluation  Patient identified by MRN, date of birth, ID band Patient awake    Reviewed: Allergy & Precautions, NPO status , Patient's Chart, lab work & pertinent test results  History of Anesthesia Complications Negative for: history of anesthetic complications  Airway Mallampati: II  TM Distance: >3 FB Neck ROM: Full    Dental  (+) Dental Advisory Given, Teeth Intact   Pulmonary neg shortness of breath, asthma , neg sleep apnea, neg COPD, neg recent URI   breath sounds clear to auscultation       Cardiovascular hypertension, Pt. on medications (-) angina (-) Past MI and (-) CHF  Rhythm:Regular     Neuro/Psych negative neurological ROS     GI/Hepatic Neg liver ROS, PUD,,,APPENDICITIS   Endo/Other  negative endocrine ROS    Renal/GU negative Renal ROS     Musculoskeletal negative musculoskeletal ROS (+)    Abdominal   Peds  Hematology negative hematology ROS (+)   Anesthesia Other Findings   Reproductive/Obstetrics                              Anesthesia Physical Anesthesia Plan  ASA: 2  Anesthesia Plan: General   Post-op Pain Management: Tylenol  PO (pre-op)* and Toradol  IV (intra-op)*   Induction: Intravenous  PONV Risk Score and Plan: 2 and Ondansetron , Dexamethasone  and Midazolam   Airway Management Planned: Oral ETT  Additional Equipment: None  Intra-op Plan:   Post-operative Plan: Extubation in OR  Informed Consent: I have reviewed the patients History and Physical, chart, labs and discussed the procedure including the risks, benefits and alternatives for the proposed anesthesia with the patient or authorized representative who has indicated his/her understanding and acceptance.     Dental advisory given  Plan Discussed with: CRNA  Anesthesia Plan Comments:         Anesthesia Quick Evaluation

## 2024-05-25 NOTE — Transfer of Care (Signed)
 Immediate Anesthesia Transfer of Care Note  Patient: Benjamin [name]  Procedure(s) Performed: APPENDECTOMY, LAPAROSCOPIC (Abdomen)  Patient Location: PACU  Anesthesia Type:General  Level of Consciousness: awake, alert , and oriented  Airway & Oxygen Therapy: Patient Spontanous Breathing and Patient connected to face mask oxygen  Post-op Assessment: Report given to RN and Post -op Vital signs reviewed and stable  Post vital signs: Reviewed and stable  Last Vitals:  Vitals Value Taken Time  BP 141/94 05/25/24 18:12  Temp 36.4 C 05/25/24 18:12  Pulse 96 05/25/24 18:12  Resp 17 05/25/24 18:01  SpO2 93 % 05/25/24 18:12  Vitals shown include unfiled device data.  Last Pain:  Vitals:   05/25/24 1821  TempSrc:   PainSc: 7       Patients Stated Pain Goal: 0 (05/25/24 1506)  Complications: No notable events documented.

## 2024-05-26 ENCOUNTER — Other Ambulatory Visit (HOSPITAL_COMMUNITY): Payer: Self-pay

## 2024-05-26 ENCOUNTER — Encounter (HOSPITAL_COMMUNITY): Payer: Self-pay | Admitting: General Surgery

## 2024-05-26 DIAGNOSIS — K529 Noninfective gastroenteritis and colitis, unspecified: Secondary | ICD-10-CM | POA: Diagnosis not present

## 2024-05-26 LAB — CBC
HCT: 34.6 % — ABNORMAL LOW (ref 39.0–52.0)
Hemoglobin: 12 g/dL — ABNORMAL LOW (ref 13.0–17.0)
MCH: 31.3 pg (ref 26.0–34.0)
MCHC: 34.7 g/dL (ref 30.0–36.0)
MCV: 90.1 fL (ref 80.0–100.0)
Platelets: 416 K/uL — ABNORMAL HIGH (ref 150–400)
RBC: 3.84 MIL/uL — ABNORMAL LOW (ref 4.22–5.81)
RDW: 12.4 % (ref 11.5–15.5)
WBC: 6.1 K/uL (ref 4.0–10.5)
nRBC: 0 % (ref 0.0–0.2)

## 2024-05-26 LAB — BASIC METABOLIC PANEL WITH GFR
Anion gap: 11 (ref 5–15)
BUN: 10 mg/dL (ref 6–20)
CO2: 26 mmol/L (ref 22–32)
Calcium: 9.2 mg/dL (ref 8.9–10.3)
Chloride: 102 mmol/L (ref 98–111)
Creatinine, Ser: 1.13 mg/dL (ref 0.61–1.24)
GFR, Estimated: >60 mL/min (ref >60)
Glucose, Bld: 150 mg/dL — ABNORMAL HIGH (ref 70–99)
Potassium: 4.3 mmol/L (ref 3.5–5.1)
Sodium: 139 mmol/L (ref 135–145)

## 2024-05-26 MED ORDER — MESALAMINE 1.2 G PO TBEC
4.8000 g | DELAYED_RELEASE_TABLET | Freq: Every day | ORAL | 0 refills | Status: DC
  Filled 2024-05-26: qty 90, 22d supply, fill #0

## 2024-05-26 MED ORDER — OXYCODONE HCL 5 MG PO TABS: 5.0000 mg | ORAL_TABLET | ORAL | Status: DC | PRN

## 2024-05-26 MED ORDER — METHOCARBAMOL 500 MG PO TABS
500.0000 mg | ORAL_TABLET | Freq: Four times a day (QID) | ORAL | 0 refills | Status: AC
  Filled 2024-05-26: qty 15, 4d supply, fill #0

## 2024-05-26 MED ORDER — OXYCODONE HCL 5 MG PO TABS
5.0000 mg | ORAL_TABLET | Freq: Four times a day (QID) | ORAL | 0 refills | Status: DC | PRN
  Filled 2024-05-26: qty 15, 4d supply, fill #0

## 2024-05-26 MED ORDER — HYDROMORPHONE HCL 1 MG/ML IJ SOLN: 0.5000 mg | INTRAMUSCULAR | Status: DC | PRN

## 2024-05-26 MED ORDER — KETOROLAC TROMETHAMINE 15 MG/ML IJ SOLN: 15.0000 mg | Freq: Three times a day (TID) | INTRAMUSCULAR | Status: DC | PRN

## 2024-05-26 MED ORDER — FENTANYL CITRATE PF 50 MCG/ML IJ SOSY
50.0000 ug | PREFILLED_SYRINGE | Freq: Once | INTRAMUSCULAR | Status: AC
  Administered 2024-05-26: 50 ug via INTRAVENOUS
  Filled 2024-05-26: qty 1

## 2024-05-26 NOTE — Plan of Care (Signed)

## 2024-05-26 NOTE — Plan of Care (Signed)
 Patient received oral Roxicodone  and IV Dilaudid  1 mg total 2 doses still complaining about abdominal pain out of 10.  Giving fentanyl  one-time dose 50 mcg.  In the setting of ulcerative colitis unable to give IV Toradol .

## 2024-05-26 NOTE — Discharge Summary (Signed)
 Physician Discharge Summary   Patient: Benjamin [name] MRN: [id number] DOB: 16-Apr-1991  Admit date:     05/24/2024  Discharge date: 05/26/24  Discharge Physician: Lonni SHAUNNA Dalton   PCP: Katrinka Garnette KIDD, MD     Recommendations at discharge:  Follow up with General Surgery for appendectomy post-op care Follow up with GI Dr. Rosalie in 3 weeks for Ulcerative colitis now started Lialda       Discharge Diagnoses: Principal Problem:   Appendicitis Active Problems:   Attention deficit disorder (ADD) in adult   Generalized anxiety disorder   Factor V Leiden mutation (HCC)   Ulcerative colitis (HCC)   Hyperlipemia   Class I obesity     Hospital Course: 33 y.o. M with HTN, factor V Leiden, UC who presented with acute RLQ pain.  GI and Gen Surg consulted.    Appendicitis Patient developed acute right lower quadrant pain.  CT showed possible inflammation of the appendix.  He was taken for laparoscopic appendectomy which was completed without complication.  His postop care was completed well, he tolerated diet well, was ambulating, and his pain was controlled with oxycodone .  Discharged with postop instructions and follow-up instructions with general surgery     Ulcerative colitis Patient has been off of medications for some time.  On CT he had some findings of chronic ulcerative colitis, which was consistent with his symptoms (chronic bowel frequency 3-5+ per day, nonbloody, no recent increase in stool frequency or consistency).    Given his smoldering symptoms, he was starting on Lialda  by GI in the hospital, and has close follow-up with Dr. Rosalie.             The [place]  Controlled Substances Registry was reviewed for this patient prior to discharge.  Consultants: General Srugery Dr. Polly GI Dr. Burnette   Procedures performed: Laparoscopic appendectomy   Disposition: Home Diet recommendation:  Regular diet  DISCHARGE  MEDICATION: Allergies as of 05/26/2024       Reactions   Penicillins Hives        Medication List     PAUSE taking these medications    methylphenidate  10 MG tablet Wait to take this until: May 30, 2024 Commonly known as: Ritalin  Take 1 tablet (10 mg total) by mouth 3 (three) times daily with meals. Fill in 1 month What changed: additional instructions       TAKE these medications    busPIRone  10 MG tablet Commonly known as: BUSPAR  TAKE 1 TABLET BY MOUTH TWICE A DAY   escitalopram  10 MG tablet Commonly known as: LEXAPRO  TAKE 1 AND 1/2 TABLETS DAILY BY MOUTH What changed: See the new instructions.   hydrOXYzine  25 MG tablet Commonly known as: ATARAX  TAKE 1 AND 1/2 TABLETS BY MOUTH EVERY 8 HOURS AS NEEDED What changed: See the new instructions.   losartan -hydrochlorothiazide  100-25 MG tablet Commonly known as: HYZAAR Take 1 tablet by mouth daily.   mesalamine  1.2 g EC tablet Commonly known as: LIALDA  Take 4 tablets (4.8 g total) by mouth daily with breakfast. Start taking on: May 27, 2024   methocarbamol  500 MG tablet Commonly known as: ROBAXIN  Take 1 tablet (500 mg total) by mouth 4 (four) times daily.   oxyCODONE  5 MG immediate release tablet Commonly known as: Oxy IR/ROXICODONE  Take 1 tablet (5 mg total) by mouth every 6 (six) hours as needed for breakthrough pain.               Discharge Care Instructions  (From admission,  onward)           Start     Ordered   05/26/24 0000  Discharge wound care:       Comments: See separate surgery instructions   05/26/24 1147            Follow-up Information     Maczis, Puja Gosai, PA-C Follow up on 06/16/2024.   Specialty: General Surgery Why: 9/11 at 3:45pm. Please bring a copy of your photo ID, insurance card and arrive 30 minutes prior to your appointment for paperwork. Contact information: [address] [place] [address] [phone]         Rosalie Kitchens, MD  Follow up.   Specialty: Gastroenterology Contact information: [address] [place] [address] [phone]                 Discharge Instructions     Discharge instructions   Complete by: As directed    **IMPORTANT DISCHARGE INSTRUCTIONS**   From Dr. Jonel:  You were admitted for abdominal pain  Here, we suspect this was mostly from mild appendicitis  You were evaluated by General Surgery and they performed a laparoscopic appendectomy See below for their post-op instructions  Follow up with them (Puja Maczis) at [facility] at the date/time listed below  If you have increasing pain, fever, vomiting or other feeling like you are sick or something is wrong with your surgery, call their office at that number    You were also evaluated by GI.  They felt you had some smoldering active ulcerative colitis.  Start mesalamine /Lialda  4.8 g daily Go see Dr. Rosalie at your appointment Sep 3 Ask him about Lialda  and for a refill  For pain, take acetaminophen  and/or oxycodone  5 mg You may also take methocarbamol /Robaxin  500 mg up to three times daily or for nighttime relief   Discharge wound care:   Complete by: As directed    See separate surgery instructions   Increase activity slowly   Complete by: As directed        Discharge Exam: Filed Weights   05/24/24 1255 05/25/24 1438  Weight: 96.4 kg 95.3 kg    General: Pt is alert, awake, not in acute distress Cardiovascular: RRR, nl S1-S2, no murmurs appreciated.   No LE edema.   Respiratory: Normal respiratory rate and rhythm.  CTAB without rales or wheezes. Abdominal: Abdomen soft and only mildly tender in surgical area.  Surgical sites appear good.  No distension or HSM.   Neuro/Psych: Strength symmetric in upper and lower extremities.  Judgment and insight appear normal.   Condition at discharge: good  The results of significant diagnostics from this hospitalization (including  imaging, microbiology, ancillary and laboratory) are listed below for reference.   Imaging Studies: CT ABDOMEN PELVIS W CONTRAST Result Date: 05/25/2024 CLINICAL DATA:  Right lower quadrant pain. Follow-up enlarged appendix and colitis. EXAM: CT ABDOMEN AND PELVIS WITH CONTRAST TECHNIQUE: Multidetector CT imaging of the abdomen and pelvis was performed using the standard protocol following bolus administration of intravenous contrast. RADIATION DOSE REDUCTION: This exam was performed according to the departmental dose-optimization program which includes automated exposure control, adjustment of the mA and/or kV according to patient size and/or use of iterative reconstruction technique. CONTRAST:  75mL OMNIPAQUE  IOHEXOL  350 MG/ML SOLN COMPARISON:  05/23/2024 FINDINGS: Lower Chest: No acute findings. Hepatobiliary: No hepatic masses identified. Gallbladder is unremarkable. No evidence of biliary ductal dilatation. Pancreas:  No mass or inflammatory  changes. Spleen: Within normal limits in size and appearance. Adrenals/Urinary Tract: No suspicious masses identified. No evidence of ureteral calculi or hydronephrosis. Unremarkable unopacified urinary bladder. Stomach/Bowel: Appendix measures up to 7 mm in diameter, mildly decreased from 10 mm on prior exam. No evidence of hyperenhancement or periappendiceal inflammatory changes. Mild colonic wall thickening is again seen involving the ascending and descending portions of the colon, without significant change since prior study and consistent with colitis. No evidence of bowel obstruction, small bowel involvement, perforation, or abscess. Vascular/Lymphatic: Shotty sub-centimeter lymph nodes in central small bowel and right ileocolic mesentery are stable, and likely reactive in etiology. No pathologically enlarged lymph nodes identified. No acute vascular findings. Reproductive:  No mass or other significant abnormality. Other:  None. Musculoskeletal:  No suspicious  bone lesions identified. IMPRESSION: No significant change in mild colitis involving the ascending and descending colon. Stable shotty sub-centimeter mesenteric lymph nodes, most likely reactive in etiology. Borderline enlarged appendix shows mild decrease in diameter since prior study. No other signs of appendicitis seen. Electronically Signed   By: Norleen DELENA Kil M.D.   On: 05/25/2024 13:43   CT ABDOMEN PELVIS W CONTRAST Result Date: 05/23/2024 CLINICAL DATA:  Right lower quadrant pain skip EXAM: CT ABDOMEN AND PELVIS WITH CONTRAST TECHNIQUE: Multidetector CT imaging of the abdomen and pelvis was performed using the standard protocol following bolus administration of intravenous contrast. RADIATION DOSE REDUCTION: This exam was performed according to the departmental dose-optimization program which includes automated exposure control, adjustment of the mA and/or kV according to patient size and/or use of iterative reconstruction technique. CONTRAST:  OMNIPAQUE  IOHEXOL  300 MG/ML  SOLN COMPARISON:  CT abdomen and pelvis 01/06/2019 FINDINGS: Lower chest: There is a calcified granuloma in the right lower lobe. The lungs are otherwise clear. Hepatobiliary: No focal liver abnormality is seen. No gallstones, gallbladder wall thickening, or biliary dilatation. Pancreas: Unremarkable. No pancreatic ductal dilatation or surrounding inflammatory changes. Spleen: Normal in size without focal abnormality. Adrenals/Urinary Tract: Adrenal glands are unremarkable. Kidneys are normal, without renal calculi, focal lesion, or hydronephrosis. Bladder is unremarkable. Stomach/Bowel: There is diffuse colonic wall thickening with trace surrounding inflammation worrisome for nonspecific colitis. The appendix is dilated measuring 1 cm there is trace surrounding inflammation. No evidence for perforation or abscess. No bowel obstruction, pneumatosis or free air. Vascular/Lymphatic: No significant vascular findings are present. No  enlarged abdominal or pelvic lymph nodes. There are nonenlarged right lower quadrant mesenteric lymph nodes. Reproductive: Prostate is unremarkable. Other: No abdominal wall hernia or abnormality. No abdominopelvic ascites. Musculoskeletal: No fracture is seen. IMPRESSION: 1. Diffuse colonic wall thickening with trace surrounding inflammation worrisome for nonspecific colitis. 2. The appendix is dilated measuring 1 cm with trace surrounding inflammation worrisome for early acute appendicitis. No evidence for perforation or abscess. Electronically Signed   By: Greig Pique M.D.   On: 05/23/2024 15:50    Microbiology: Results for orders placed or performed in visit on 01/30/20  Novel Coronavirus, NAA ([facility])     Status: None   Collection Time: 01/30/20  8:31 AM   Specimen: Nasopharyngeal(NP) swabs in vial transport medium   NASOPHARYNGE  TESTING  Result Value Ref Range Status   SARS-CoV-2, NAA Not Detected Not Detected Final    Comment: This nucleic acid amplification test was developed and its performance characteristics determined by [employer]. Nucleic acid amplification tests include RT-PCR and TMA. This test has not been FDA cleared or approved. This test has been authorized by FDA under an Emergency Use  Authorization (EUA). This test is only authorized for the duration of time the declaration that circumstances exist justifying the authorization of the emergency use of in vitro diagnostic tests for detection of SARS-CoV-2 virus and/or diagnosis of COVID-19 infection under section 564(b)(1) of the Act, 21 U.S.C. 639aaa-6(a) (1), unless the authorization is terminated or revoked sooner. When diagnostic testing is negative, the possibility of a false negative result should be considered in the context of a patient's recent exposures and the presence of clinical signs and symptoms consistent with COVID-19. An individual without symptoms of COVID-19 and who is not shedding  SARS-CoV-2 virus wo uld expect to have a negative (not detected) result in this assay.   SARS-COV-2, NAA 2 DAY TAT     Status: None   Collection Time: 01/30/20  8:31 AM   NASOPHARYNGE  TESTING  Result Value Ref Range Status   SARS-CoV-2, NAA 2 DAY TAT Performed  Final    Labs: CBC: Recent Labs  Lab 05/23/24 1101 05/24/24 1235 05/25/24 0442 05/26/24 0501  WBC 7.0 6.8 5.9 6.1  NEUTROABS 4.2 3.2  --   --   HGB 13.5 13.2 11.9* 12.0*  HCT 38.8* 37.8* 34.2* 34.6*  MCV 90.1 90.2 90.0 90.1  PLT 381.0 410* 332 416*   Basic Metabolic Panel: Recent Labs  Lab 05/23/24 1101 05/24/24 1235 05/25/24 0442 05/26/24 0501  NA 136 136 138 139  K 3.4* 3.6 3.9 4.3  CL 99 101 100 102  CO2 28 26 29 26   GLUCOSE 82 94 90 150*  BUN 14 13 12 10   CREATININE 1.10 1.15 1.23 1.13  CALCIUM 9.5 9.6 9.1 9.2   Liver Function Tests: Recent Labs  Lab 05/23/24 1101 05/24/24 1235 05/25/24 0442  AST 19 24 22   ALT 33 36 33  ALKPHOS 91 78 65  BILITOT 0.5 0.6 0.6  PROT 7.8 8.0 6.6  ALBUMIN 4.5 3.9 3.3*   CBG: No results for input(s): GLUCAP in the last 168 hours.  Discharge time spent: approximately 45 minutes spent on discharge counseling, evaluation of patient on day of discharge, and coordination of discharge planning with nursing, social work, pharmacy and case management  Signed: Lonni SHAUNNA Dalton, MD [facility] 05/26/2024

## 2024-05-26 NOTE — Care Management Obs Status (Signed)
 MEDICARE OBSERVATION STATUS NOTIFICATION   Patient Details  Name: Benjamin [name] MRN: [id number] Date of Birth: [birth year]   Medicare Observation Status Notification Given:       Claretta Deed 05/26/2024, 8:17 AM

## 2024-05-26 NOTE — Progress Notes (Signed)
 1 Day Post-Op  Subjective: CC: Reports his right sided abdominal pain has resolved. He has soreness near his incisions on the left side of his abdomen that worse with movement and currently well controlled. Otherwise no abdominal pain. Tolerated grits for breakfast with n/v. Passing flatus. No BM. Voiding. Mobilizing.   Objective: Vital signs in last 24 hours: Temp:  [97.6 F (36.4 C)-98 F (36.7 C)] 98 F (36.7 C) (08/21 0800) Pulse Rate:  [65-96] 66 (08/21 0800) Resp:  [14-24] 16 (08/21 0404) BP: (118-145)/(68-96) 124/90 (08/21 0800) SpO2:  [93 %-97 %] 97 % (08/21 0800) Weight:  [95.3 kg] 95.3 kg (08/20 1438) Last BM Date : 05/24/24 (prior to surgery)  Intake/Output from previous day: 08/20 0701 - 08/21 0700 In: 1708.9 [P.O.:720; I.V.:587.6; IV Piggyback:401.3] Out: 300 [Urine:300] Intake/Output this shift: No intake/output data recorded.  PE: Gen:  Alert, NAD, pleasant Pulm:  Rate and effort normal Abd: Soft, no distension, appropriately tender around laparoscopic incisions, no rigidity or guarding and otherwise NT, +BS. Incisions with glue intact appears well and are without drainage, bleeding, or signs of infection.    Lab Results:  Recent Labs    05/25/24 0442 05/26/24 0501  WBC 5.9 6.1  HGB 11.9* 12.0*  HCT 34.2* 34.6*  PLT 332 416*   BMET Recent Labs    05/25/24 0442 05/26/24 0501  NA 138 139  K 3.9 4.3  CL 100 102  CO2 29 26  GLUCOSE 90 150*  BUN 12 10  CREATININE 1.23 1.13  CALCIUM 9.1 9.2   PT/INR No results for input(s): LABPROT, INR in the last 72 hours. CMP     Component Value Date/Time   NA 139 05/26/2024 0501   NA 139 12/12/2022 1505   K 4.3 05/26/2024 0501   CL 102 05/26/2024 0501   CO2 26 05/26/2024 0501   GLUCOSE 150 (H) 05/26/2024 0501   BUN 10 05/26/2024 0501   BUN 15 12/12/2022 1505   CREATININE 1.13 05/26/2024 0501   CREATININE 1.10 07/30/2018 1519   CALCIUM 9.2 05/26/2024 0501   PROT 6.6 05/25/2024 0442    ALBUMIN 3.3 (L) 05/25/2024 0442   AST 22 05/25/2024 0442   ALT 33 05/25/2024 0442   ALKPHOS 65 05/25/2024 0442   BILITOT 0.6 05/25/2024 0442   GFRNONAA >60 05/26/2024 0501   Lipase  No results found for: LIPASE  Studies/Results: CT ABDOMEN PELVIS W CONTRAST Result Date: 05/25/2024 CLINICAL DATA:  Right lower quadrant pain. Follow-up enlarged appendix and colitis. EXAM: CT ABDOMEN AND PELVIS WITH CONTRAST TECHNIQUE: Multidetector CT imaging of the abdomen and pelvis was performed using the standard protocol following bolus administration of intravenous contrast. RADIATION DOSE REDUCTION: This exam was performed according to the departmental dose-optimization program which includes automated exposure control, adjustment of the mA and/or kV according to patient size and/or use of iterative reconstruction technique. CONTRAST:  75mL OMNIPAQUE  IOHEXOL  350 MG/ML SOLN COMPARISON:  05/23/2024 FINDINGS: Lower Chest: No acute findings. Hepatobiliary: No hepatic masses identified. Gallbladder is unremarkable. No evidence of biliary ductal dilatation. Pancreas:  No mass or inflammatory changes. Spleen: Within normal limits in size and appearance. Adrenals/Urinary Tract: No suspicious masses identified. No evidence of ureteral calculi or hydronephrosis. Unremarkable unopacified urinary bladder. Stomach/Bowel: Appendix measures up to 7 mm in diameter, mildly decreased from 10 mm on prior exam. No evidence of hyperenhancement or periappendiceal inflammatory changes. Mild colonic wall thickening is again seen involving the ascending and descending portions of the colon, without significant change since  prior study and consistent with colitis. No evidence of bowel obstruction, small bowel involvement, perforation, or abscess. Vascular/Lymphatic: Shotty sub-centimeter lymph nodes in central small bowel and right ileocolic mesentery are stable, and likely reactive in etiology. No pathologically enlarged lymph nodes  identified. No acute vascular findings. Reproductive:  No mass or other significant abnormality. Other:  None. Musculoskeletal:  No suspicious bone lesions identified. IMPRESSION: No significant change in mild colitis involving the ascending and descending colon. Stable shotty sub-centimeter mesenteric lymph nodes, most likely reactive in etiology. Borderline enlarged appendix shows mild decrease in diameter since prior study. No other signs of appendicitis seen. Electronically Signed   By: Norleen DELENA Kil M.D.   On: 05/25/2024 13:43    Anti-infectives: Anti-infectives (From admission, onward)    Start     Dose/Rate Route Frequency Ordered Stop   05/24/24 2200  ciprofloxacin  (CIPRO ) IVPB 400 mg       Placed in And Linked Group   400 mg 200 mL/hr over 60 Minutes Intravenous Every 12 hours 05/24/24 1542     05/24/24 2200  metroNIDAZOLE  (FLAGYL ) IVPB 500 mg       Placed in And Linked Group   500 mg 100 mL/hr over 60 Minutes Intravenous Every 12 hours 05/24/24 1542     05/24/24 1245  ciprofloxacin  (CIPRO ) IVPB 400 mg       Placed in And Linked Group   400 mg 200 mL/hr over 60 Minutes Intravenous  Once 05/24/24 1235 05/24/24 1429   05/24/24 1245  metroNIDAZOLE  (FLAGYL ) IVPB 500 mg       Placed in And Linked Group   500 mg 100 mL/hr over 60 Minutes Intravenous  Once 05/24/24 1235 05/24/24 1430        Assessment/Plan POD 1 s/p laparoscopic appendectomy by Dr. Polly on 05/25/24 - UC per GI. Was off tx prior to admission. On mesalamine  currently. They are holding on steroids.  - No further abx needed from our standpoint. Defer to primary and GI - Okay for regular diet - Adjust pain medications - Mobilize, pulm toilet - If patient is tolerating regular diet and pain controlled with PO medications he is okay for discharge from our standpoint. Will arrange follow up in our office and send pain medications to his pharmacy. He reports he does not need a work note. Discussed discharge  instructions, restrictions and return/call back precautions. Will send a message to [facility].   FEN - Reg, IVF per primary  VTE - SCDs, okay for chem ppx from a general surgery standpoint ID - Cipro /Flagyl    LOS: 0 days    Ozell CHRISTELLA Shaper, [facility] 05/26/2024, 9:25 AM Please see Amion for pager number during day hours 7:00am-4:30pm

## 2024-05-26 NOTE — TOC Transition Note (Signed)
 Transition of Care [facility]) - Discharge Note   Patient Details  Name: Benjamin [name] MRN: [id number] Date of Birth: 08-Dec-1990  Transition of Care [facility]) CM/SW Contact:  Tom-Johnson, Dicky Boer Daphne, RN Phone Number: 05/26/2024, 11:20 AM   Clinical Narrative:     Patient is scheduled for discharge today.  Outpatient f/u, hospital f/u and discharge instructions on AVS. Prescriptions sent to [facility] pharmacy and patient will receive meds prior discharge. No TOC needs or recommendations noted. Mother, Cathlean to transport at discharge.  No further TOC needs noted.       Final next level of care: Home/Self Care Barriers to Discharge: Barriers Resolved   Patient Goals and CMS Choice Patient states their goals for this hospitalization and ongoing recovery are:: To return home CMS Medicare.gov Compare Post Acute Care list provided to:: Patient Choice offered to / list presented to : NA      Discharge Placement                Patient to be transferred to facility by: Mother Name of family member notified: Cathlean    Discharge Plan and Services Additional resources added to the After Visit Summary for                  DME Arranged: N/A DME Agency: NA       HH Arranged: NA HH Agency: NA        Social Drivers of Health (SDOH) Interventions SDOH Screenings   Food Insecurity: No Food Insecurity (05/24/2024)  Housing: Low Risk  (05/24/2024)  Transportation Needs: No Transportation Needs (05/24/2024)  Utilities: Not At Risk (05/24/2024)  Depression (PHQ2-9): Medium Risk (05/23/2024)  Tobacco Use: Low Risk  (05/25/2024)     Readmission Risk Interventions     No data to display

## 2024-05-27 ENCOUNTER — Ambulatory Visit: Payer: Self-pay | Admitting: Family Medicine

## 2024-05-27 LAB — SURGICAL PATHOLOGY

## 2024-05-27 NOTE — Anesthesia Postprocedure Evaluation (Signed)
 Anesthesia Post Note  Patient: Benjamin [name]  Procedure(s) Performed: APPENDECTOMY, LAPAROSCOPIC (Abdomen)     Patient location during evaluation: PACU Anesthesia Type: General Level of consciousness: awake and alert Pain management: pain level controlled Vital Signs Assessment: post-procedure vital signs reviewed and stable Respiratory status: spontaneous breathing, nonlabored ventilation and respiratory function stable Cardiovascular status: blood pressure returned to baseline and stable Postop Assessment: no apparent nausea or vomiting Anesthetic complications: no   No notable events documented.  Last Vitals:  Vitals:   05/26/24 0404 05/26/24 0800  BP: 129/87 (!) 124/90  Pulse: 67 66  Resp: 16   Temp: 36.5 C 36.7 C  SpO2: 96% 97%    Last Pain:  Vitals:   05/26/24 0916  TempSrc:   PainSc: 4                  Butler Levander Pinal

## 2024-06-09 LAB — LAB REPORT - SCANNED: HM Hepatitis Screen: NEGATIVE

## 2024-06-13 ENCOUNTER — Telehealth: Payer: Self-pay | Admitting: Family Medicine

## 2024-06-13 NOTE — Telephone Encounter (Signed)
 I typically need to make sure no active symptoms such as unintentional weight loss, fever, night sweats, cough with blood in it.  I also generally would do a referral to infectious disease and do a chest x-ray.  You can check on him on symptoms and place referral to infectious disease and see if he is interested in a chest x-ray  We would likely need a copy of the labs to be on file here if he can have those sent to us  as well

## 2024-06-13 NOTE — Telephone Encounter (Signed)
 Please review patient message and advise.    Copied from CRM [id number]. 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 He stated that nobody has reached out to him to discuss and he would ,ike to know the course of action.Please advise.

## 2024-06-14 NOTE — Telephone Encounter (Signed)
 MyChart message sent to patient. Dr. San message included.

## 2024-06-15 ENCOUNTER — Ambulatory Visit (INDEPENDENT_AMBULATORY_CARE_PROVIDER_SITE_OTHER): Admitting: Infectious Diseases

## 2024-06-15 ENCOUNTER — Encounter: Payer: Self-pay | Admitting: Infectious Diseases

## 2024-06-15 ENCOUNTER — Other Ambulatory Visit: Payer: Self-pay

## 2024-06-15 VITALS — BP 124/89 | HR 97 | Temp 97.6°F | Ht 70.0 in | Wt 212.0 lb

## 2024-06-15 DIAGNOSIS — Z227 Latent tuberculosis: Secondary | ICD-10-CM | POA: Diagnosis not present

## 2024-06-15 DIAGNOSIS — L0291 Cutaneous abscess, unspecified: Secondary | ICD-10-CM

## 2024-06-15 NOTE — Patient Instructions (Addendum)
 Today, you were seen for a positive TB exposure test before starting Humira for your ulcerative colitis. We discussed your history of ulcerative colitis, recent travels, and current medications. You also mentioned a painful, swollen area on your elbow and your history of hypertension, anxiety, and depression.  YOUR PLAN:  -LATENT TUBERCULOSIS INFECTION: Latent tuberculosis infection means you have been exposed to TB bacteria, but it is not currently active. To prevent it from becoming active, especially with your upcoming Humira treatment, you will start taking Rifampin 600 mg once daily for four months. A chest x-ray has been ordered to check for active TB. Please reduce your alcohol consumption to minimize liver strain while on this medication.  -ULCERATIVE COLITIS: Ulcerative colitis is a chronic condition causing inflammation in your colon. You are currently managing it with mesalamine  and budesonide, and we are awaiting approval for Humira. Continue your current medications and proceed with the TB treatment to start Humira soon.  -CUTANEOUS ABSCESS OF LEFT ELBOW: A cutaneous abscess is a collection of pus under the skin, often caused by infection. Your left elbow is swollen and painful, possibly from irritation during your hospital stay. We have taken a culture from the abscess and recommend applying triple antibiotic ointment and covering it with a bandage. We will await the culture results before starting any antibiotics.  -MAJOR DEPRESSIVE DISORDER AND ANXIETY DISORDER: These are mental health conditions that you are managing with Buspar , Lexapro , and Ritalin . Be aware that Rifampin may reduce the effectiveness of Buspar , so monitor for any changes in your anxiety symptoms.  -ESSENTIAL HYPERTENSION: Essential hypertension is high blood pressure with no identifiable cause. You are managing it with losartan  hydrochlorothiazide . Continue your current medication.  INSTRUCTIONS:  Please get a  chest x-ray as soon as possible to check for active TB. Follow the prescribed Rifampin regimen and reduce alcohol consumption. Continue your current medications for ulcerative colitis, hypertension, anxiety, and depression. Apply triple antibiotic ointment to your elbow and keep it covered. Monitor for any changes in your anxiety symptoms and await the culture results for your elbow before starting any antibiotics.  Brand Names: [place]  Rifadin Brand Names: [place] Rofact What is this drug used for?  It is used to treat TB (tuberculosis).  It is used to stop the spread of meningitis in people who carry the bacteria but are not sick with the disease.  It may be given to you for other reasons. Talk with the doctor. What do I need to tell my doctor BEFORE I take this drug?  If you are allergic to this drug; any part of this drug; or any other drugs, foods, or substances. Tell your doctor about the allergy and what signs you had.  If this drug caused liver problems before.  If you take any drugs (prescription or OTC, natural products, vitamins) that must not be taken with this drug, like certain drugs that are used for HIV, infections, depression, and others. There are many drugs that must not be taken with this drug. Your doctor or pharmacist can tell you if you are taking a drug that must not be taken with this drug. This is not a list of all drugs or health problems that interact with this drug. Tell your doctor and pharmacist about all of your drugs (prescription or OTC, natural products, vitamins) and health problems. You must check to make sure that it is safe for you to take this drug with all of your drugs and health problems. Do not start,  stop, or change the dose of any drug without checking with your doctor. What are some things I need to know or do while I take this drug?  Tell all of your health care providers that you take this drug. This includes your doctors, nurses, pharmacists, and  dentists.  Have your blood work and other lab tests checked as you have been told by your doctor.  This drug may affect certain lab tests. Tell all of your health care providers and lab workers that you take this drug.  Avoid drinking alcohol while taking this drug.  If you have high blood sugar (diabetes), you will need to watch your blood sugar closely. Tell your doctor if you get signs of high blood sugar like confusion, feeling sleepy, unusual thirst or hunger, passing urine more often, flushing, fast breathing, or breath that smells like fruit.  This drug may stain contact lenses.  Do not use longer than you have been told. A second infection may happen.  Liver problems have happened with rifampin. Sometimes these problems have been deadly, especially in people who had liver disease and in people who took this drug with other drugs that may raise the chance of liver problems. If you have questions, talk with the doctor.  Certain lung problems have happened with this drug. These lung problems could be deadly. If you have questions, talk with the doctor.  If you are 93 or older, use this drug with care. You could have more side effects.  Birth control pills and other hormone-based birth control may not work as well to prevent pregnancy. Use some other kind of birth control also like a condom when taking this drug.  Tell your doctor if you are pregnant, plan on getting pregnant, or are breast-feeding. You will need to talk about the benefits and risks to you and the baby. What are some side effects that I need to call my doctor about right away? WARNING/CAUTION: Even though it may be rare, some people may have very bad and sometimes deadly side effects when taking a drug. Tell your doctor or get medical help right away if you have any of the following signs or symptoms that may be related to a very bad side effect: All products:  Signs of an allergic reaction, like rash; hives; itching; red,  swollen, blistered, or peeling skin with or without fever; wheezing; tightness in the chest or throat; trouble breathing, swallowing, or talking; unusual hoarseness; or swelling of the mouth, face, lips, tongue, or throat.  Signs of liver problems like dark urine, tiredness, decreased appetite, upset stomach or stomach pain, light-colored stools, throwing up, or yellow skin or eyes.  Signs of kidney problems like unable to pass urine, change in how much urine is passed, blood in the urine, or a big weight gain.  Dizziness or passing out.  Flu-like signs.  Fever, chills, or sore throat; any unexplained bruising or bleeding; or feeling very tired or weak.  Joint pain or swelling.  Muscle pain or weakness.  Chest pain.  Sweating a lot.  A heartbeat that does not feel normal.  Swelling in the arms or legs.  Purple spots or redness of the skin.  Change in balance.  Feeling confused, not able to focus, or change in behavior.  Period (menstrual) changes.  Change in eyesight, eye pain, or severe eye irritation.  Change in tooth color. These changes may be long-lasting.  Call your doctor right away if you have new or worse symptoms  like cough, fever, feeling tired or weak, shortness of breath, headache, pain, night sweats, swollen gland, decreased appetite, weight loss, or skin sores.  Diarrhea is common with antibiotics. Rarely, a severe form called C diff-associated diarrhea (CDAD) may happen. Sometimes, this has led to a deadly bowel problem. CDAD may happen during or a few months after taking antibiotics. Call your doctor right away if you have stomach pain, cramps, or very loose, watery, or bloody stools. Check with your doctor before treating diarrhea.  Severe skin reactions may happen with this drug. These include Stevens-Johnson syndrome (SJS), toxic epidermal necrolysis (TEN), and other serious reactions. Sometimes, body organs may also be affected. These reactions can be deadly. Get medical help  right away if you have signs like red, swollen, blistered, or peeling skin; red or irritated eyes; sores in your mouth, throat, nose, eyes, genitals, or any areas of skin; fever; chills; body aches; shortness of breath; or swollen glands.  Very bad and sometimes deadly blood problems like thrombotic thrombocytopenic purpura/hemolytic uremic syndrome (TTP/HUS) have happened with this drug in some people. Call your doctor right away if you feel very tired or weak or have any bruising or bleeding; dark urine or yellow skin or eyes; pale skin; change in the amount of urine passed; change in eyesight; change in strength on 1 side is greater than the other, trouble speaking or thinking, or change in balance; or fever. Injection:  This drug may irritate the vein. If the drug leaks from the vein, it may also cause irritation around that area. Tell your nurse if you have any redness, burning, pain, swelling, or leaking of fluid where the drug is going into your body. What are some other side effects of this drug? All drugs may cause side effects. However, many people have no side effects or only have minor side effects. Call your doctor or get medical help if any of these side effects or any other side effects bother you or do not go away:  Feeling sleepy.  Gas.  Diarrhea, upset stomach, or throwing up.  Heartburn.  Stomach cramps.  Change in color of body fluids to orange or red. These are not all of the side effects that may occur. If you have questions about side effects, call your doctor. Call your doctor for medical advice about side effects. You may report side effects to your national health agency. How is this drug best taken? Use this drug as ordered by your doctor. Read all information given to you. Follow all instructions closely. Capsules:  Take this drug on an empty stomach. Take at least 1 hour before or at least 2 hours after a meal.  Take with a full glass of water.  Keep taking this drug  as you have been told by your doctor or other health care provider, even if you feel well.  If you are taking an antacid, take this drug at least 1 hour before taking the antacid.  A liquid (suspension) can be made if you cannot swallow pills. Talk with your doctor or pharmacist.  If a liquid (suspension) is made, shake well before use.  Measure liquid doses carefully. Use the measuring device that comes with this drug. If there is none, ask the pharmacist for a device to measure this drug. Injection:  It is given as an infusion into a vein over a period of time. What do I do if I miss a dose? Capsules:  Take a missed dose as soon as you  think about it.  If it is close to the time for your next dose, skip the missed dose and go back to your normal time.  Do not take 2 doses at the same time or extra doses. Injection:  Call your doctor to find out what to do. How do I store and/or throw out this drug? Capsules:  Store at room temperature in a dry place. Do not store in a bathroom.  Protect from heat and light.  If a liquid (suspension) is made, store at room temperature or in a refrigerator. Throw away any part not used after 4 weeks. Injection:  If you need to store this drug at home, talk with your doctor, nurse, or pharmacist about how to store it. All products:  Keep all drugs in a safe place. Keep all drugs out of the reach of children and pets.  Throw away unused or expired drugs. Do not flush down a toilet or pour down a drain unless you are told to do so. Check with your pharmacist if you have questions about the best way to throw out drugs. There may be drug take-back programs in your area. General drug facts  If your symptoms or health problems do not get better or if they become worse, call your doctor.  Do not share your drugs with others and do not take anyone else's drugs.  Some drugs may have another patient information leaflet. If you have any questions about this drug,  please talk with your doctor, nurse, pharmacist, or other health care provider.  If you think there has been an overdose, call your poison control center or get medical care right away. Be ready to tell or show what was taken, how much, and when it happened.   [address] [place], [place] [address]  Phone [phone]  Hours of Operation 6:30 am - 7 pm, Monday-Sunday (MRI Only)  7:00 am - 5 pm Monday-Friday (CT)  8:00 am - 5 pm (Ultrasound, X-Ray)

## 2024-06-15 NOTE — Progress Notes (Signed)
 NAME: Benjamin [name]  DOB: May 21, 1991  MRN: [id number]  Date/Time: 06/15/2024 1:36 PM   Subjective:   Pt consented to the use of AI scribe ?Benjamin [name] is a 33 year old male with ulcerative colitis , GAD, who presents for evaluation of a positive quantiferon gold  that was done prior to starting Humira. He was referred by Dr. Rosalie  GI   He reports a history of ulcerative colitis, diagnosed five years ago via colonoscopy, and states he has had symptoms for his whole life. He is currently taking mesalamine  and budesonide and is in the process of being approved for Humira. There is a family history of ulcerative colitis on his father's side, but no family members have undergone surgery for the condition. No weight loss, night sweats, fever, cough, or shortness of breath.   He has not had a chest x-ray yet.  He has traveled to [place] twice for safaris and visited villages, but has not worked in those areas. He also traveled to the [place] in November. He is not aware of any direct contact with TB.  He underwent an appendectomy in aug 2025. CT abdomen showed inflammation in colon and he was started on mesalamine  on discharge  He has a history of hypertension and is currently taking losartan  hydrochlorothiazide . He also experiences anxiety and depression, for which he takes Buspar , Lexapro , and hydroxyzine . He consumes 10 to 13 alcoholic drinks per week, primarily on weekends, and does not smoke or does any illegal substances.  He reports a painful, swollen area on his elbow that has been present for about two and a half weeks, which he suspects may have been irritated during his recent hospital stay. There is no discharge of pus from the area. No history of MRSA.     Past Medical History:  Diagnosis Date   ADHD    Allergy    Anxiety    Asthma    only before sports   Depression    Factor 5 Leiden mutation, heterozygous [facility])     Past Surgical History:   Procedure Laterality Date   COLONOSCOPY     LAPAROSCOPIC APPENDECTOMY N/A 05/25/2024   Procedure: APPENDECTOMY, LAPAROSCOPIC;  Surgeon: Polly Cordella LABOR, MD;  Location: [facility] OR;  Service: General;  Laterality: N/A;   none      Social History   Socioeconomic History   Marital status: Single    Spouse name: Not on file   Number of children: Not on file   Years of education: Not on file   Highest education level: Not on file  Occupational History   Occupation: financial advisor  Tobacco Use   Smoking status: Never   Smokeless tobacco: Never  Vaping Use   Vaping status: Never Used  Substance and Sexual Activity   Alcohol use: Yes    Alcohol/week: 14.0 standard drinks of alcohol    Types: 14 Standard drinks or equivalent per week    Comment: Daily alcohol   Drug use: No   Sexual activity: Not on file  Other Topics Concern   Not on file  Social History Narrative   Single. Not dating.       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       Hobbies: a lot of working out- 7 days a week, avid Therapist, nutritional and  Radiographer, therapeutic, enjoyed lacrosse in the past   Social Drivers of Corporate investment banker Strain: Not on file  Food Insecurity: No Food Insecurity (05/24/2024)   Hunger Vital Sign    Worried About Running Out of Food in the Last Year: Never true    Ran Out of Food in the Last Year: Never true  Transportation Needs: No Transportation Needs (05/24/2024)   PRAPARE - Administrator, Civil Service (Medical): No    Lack of Transportation (Non-Medical): No  Physical Activity: Not on file  Stress: Not on file  Social Connections: Not on file  Intimate Partner Violence: Not At Risk (05/24/2024)   Humiliation, Afraid, Rape, and Kick questionnaire    Fear of Current or Ex-Partner: No    Emotionally Abused: No    Physically  Abused: No    Sexually Abused: No    Family History  Problem Relation Age of Onset   Hypertension Mother        Has CAD   CAD Mother    Hypertension Father    Alcoholism Father    Alcoholism Paternal Grandfather    Allergies  Allergen Reactions   Penicillins Hives   I? Current Outpatient Medications  Medication Sig Dispense Refill   budesonide (ENTOCORT EC) 3 MG 24 hr capsule Take 9 mg by mouth daily.     busPIRone  (BUSPAR ) 10 MG tablet TAKE 1 TABLET BY MOUTH TWICE A DAY 180 tablet 2   escitalopram  (LEXAPRO ) 10 MG tablet TAKE 1 AND 1/2 TABLETS DAILY BY MOUTH 135 tablet 1   hydrOXYzine  (ATARAX ) 25 MG tablet TAKE 1 AND 1/2 TABLETS BY MOUTH EVERY 8 HOURS AS NEEDED 270 tablet 1   losartan -hydrochlorothiazide  (HYZAAR) 100-25 MG tablet Take 1 tablet by mouth daily. 90 tablet 3   mesalamine  (LIALDA ) 1.2 g EC tablet Take 4 tablets (4.8 g total) by mouth daily with breakfast. 90 tablet 0   methocarbamol  (ROBAXIN ) 500 MG tablet Take 1 tablet (500 mg total) by mouth 4 (four) times daily. 15 tablet 0   methylphenidate  (RITALIN ) 10 MG tablet Take 1 tablet (10 mg total) by mouth 3 (three) times daily with meals. Fill in 1 month 90 tablet 0   oxyCODONE  (OXY IR/ROXICODONE ) 5 MG immediate release tablet Take 1 tablet (5 mg total) by mouth every 6 (six) hours as needed for breakthrough pain. 15 tablet 0   No current facility-administered medications for this visit.     Abtx:  Anti-infectives (From admission, onward)    None       REVIEW OF SYSTEMS:  Const: negative fever, negative chills, negative weight loss Eyes: negative diplopia or visual changes, negative eye pain ENT: negative coryza, negative sore throat Resp: negative cough, hemoptysis, dyspnea Cards: negative for chest pain, palpitations, lower extremity edema GU: negative for frequency, dysuria and hematuria GI:  diarrhea,  Skin: negative for rash and pruritus Heme: negative for easy bruising and gum/nose bleeding MS:  negative for myalgias, arthralgias, back pain and muscle weakness Neurolo:negative for headaches, dizziness, vertigo, memory problems  Psych: negative for feelings of anxiety, depression  Endocrine: negative for thyroid, diabetes Allergy/Immunology penicllin as a baby: Objective:  VITALS:  BP 124/89   Pulse 97   Temp 97.6 F (36.4 C) (Temporal)   Ht 5' 10 (1.778 m)   Wt 212 lb (96.2 kg)   SpO2 95%   BMI 30.42 kg/m   PHYSICAL EXAM:  General: Alert, cooperative, no distress, appears stated age.  Head: Normocephalic, without obvious  abnormality, atraumatic. Eyes: Conjunctivae clear, anicteric sclerae. Pupils are equal ENT Nares normal. No drainage or sinus tenderness. Lips, mucosa, and tongue normal. No Thrush Neck: Supple, symmetrical, no adenopathy, thyroid: non tender no carotid bruit and no JVD. Back: No CVA tenderness. Lungs: Clear to auscultation bilaterally. No Wheezing or Rhonchi. No rales. Heart: Regular rate and rhythm, no murmur, rub or gallop. Abdomen: not examined Extremities: atraumatic, no cyanosis. No edema. No clubbing Skin: No rashes or lesions. Or bruising Lymph: Cervical, supraclavicular normal. Neurologic: Grossly non-focal Pertinent Labs Lab Results CBC    Component Value Date/Time   WBC 6.1 05/26/2024 0501   RBC 3.84 (L) 05/26/2024 0501   HGB 12.0 (L) 05/26/2024 0501   HCT 34.6 (L) 05/26/2024 0501   PLT 416 (H) 05/26/2024 0501   MCV 90.1 05/26/2024 0501   MCH 31.3 05/26/2024 0501   MCHC 34.7 05/26/2024 0501   RDW 12.4 05/26/2024 0501   LYMPHSABS 2.6 05/24/2024 1235   MONOABS 0.9 05/24/2024 1235   EOSABS 0.1 05/24/2024 1235   BASOSABS 0.0 05/24/2024 1235       Latest Ref Rng & Units 05/26/2024    5:01 AM 05/25/2024    4:42 AM 05/24/2024   12:35 PM  CMP  Glucose 70 - 99 mg/dL 849  90  94   BUN 6 - 20 mg/dL 10  12  13    Creatinine 0.61 - 1.24 mg/dL 8.86  8.76  8.84   Sodium 135 - 145 mmol/L 139  138  136   Potassium 3.5 - 5.1 mmol/L 4.3   3.9  3.6   Chloride 98 - 111 mmol/L 102  100  101   CO2 22 - 32 mmol/L 26  29  26    Calcium 8.9 - 10.3 mg/dL 9.2  9.1  9.6   Total Protein 6.5 - 8.1 g/dL  6.6  8.0   Total Bilirubin 0.0 - 1.2 mg/dL  0.6  0.6   Alkaline Phos 38 - 126 U/L  65  78   AST 15 - 41 U/L  22  24   ALT 0 - 44 U/L  33  36    Interaction with Rifampin and his other meds      IMAGING RESULTS: none ? 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 He has a history of travel to [place] and the [place] but no known TB exposure. With planned Humira initiation for ulcerative colitis, there is an increased risk of TB reactivation. A chest x-ray is ordered to check for active TB. Recent LFTS from 8/20 N Rifampin will be  prescribed at 600 mg once daily for four months due to its shorter duration and manageable interactions, especially with Buspar . Will send prescription to his pharmacy after getting the cxr He should reduce alcohol consumption to minimize liver strain.  Ulcerative colitis   Diagnosed five years ago with a paternal family history. Currently managed with mesalamine  and budesonide, and awaiting Humira approval. No current exacerbation. Continue mesalamine  and budesonide. He needs atlease a month of latent TB Rx before he can start  Humira.  Cutaneous abscess of left elbow   The left elbow is swollen and painful, possibly from a pimple or irritation from an emergency room visit. No pus discharge is noted. Differential includes simple staph infection versus MRSA. A culture from the abscess is obtained. Apply triple antibiotic ointment and cover with a bandage. Await culture  results before starting antibiotics.    anxiety disorder   Managed with Buspar , Lexapro , Monitor for changes in anxiety symptoms due to potential interaction between rifampin and Buspar ,  which may reduce Buspar 's effectiveness. Also takes ritalin   Essential hypertension   Managed with losartan  hydrochlorothiazide .  I discussed his condition in detail with him Follow up with him after CXR/ send rifampin.    ________________________________________________

## 2024-06-19 LAB — AEROBIC CULTURE
AER RESULT:: NO GROWTH
MICRO NUMBER:: 16952161
SPECIMEN QUALITY:: ADEQUATE

## 2024-06-21 ENCOUNTER — Ambulatory Visit: Payer: Self-pay

## 2024-08-31 ENCOUNTER — Ambulatory Visit (HOSPITAL_COMMUNITY)
Admission: RE | Admit: 2024-08-31 | Discharge: 2024-08-31 | Disposition: A | Discharge status: Home / Self Care | Source: Ambulatory Visit | Attending: Infectious Diseases | Admitting: Infectious Diseases

## 2024-08-31 DIAGNOSIS — Z227 Latent tuberculosis: Secondary | ICD-10-CM | POA: Insufficient documentation

## 2024-09-06 ENCOUNTER — Encounter: Payer: Self-pay | Admitting: Infectious Diseases

## 2024-09-06 ENCOUNTER — Ambulatory Visit: Attending: Infectious Diseases | Admitting: Infectious Diseases

## 2024-09-06 DIAGNOSIS — F419 Anxiety disorder, unspecified: Secondary | ICD-10-CM | POA: Insufficient documentation

## 2024-09-06 DIAGNOSIS — K5190 Ulcerative colitis, unspecified, without complications: Secondary | ICD-10-CM | POA: Insufficient documentation

## 2024-09-06 DIAGNOSIS — F909 Attention-deficit hyperactivity disorder, unspecified type: Secondary | ICD-10-CM | POA: Insufficient documentation

## 2024-09-06 DIAGNOSIS — I10 Essential (primary) hypertension: Secondary | ICD-10-CM | POA: Insufficient documentation

## 2024-09-06 DIAGNOSIS — Z227 Latent tuberculosis: Secondary | ICD-10-CM | POA: Diagnosis not present

## 2024-09-06 DIAGNOSIS — Z79899 Other long term (current) drug therapy: Secondary | ICD-10-CM | POA: Insufficient documentation

## 2024-09-06 DIAGNOSIS — R7612 Nonspecific reaction to cell mediated immunity measurement of gamma interferon antigen response without active tuberculosis: Secondary | ICD-10-CM | POA: Insufficient documentation

## 2024-09-06 MED ORDER — RIFAMPIN 300 MG PO CAPS: 600.0000 mg | ORAL_CAPSULE | Freq: Every day | ORAL | 3 refills | Status: AC

## 2024-09-06 NOTE — Progress Notes (Signed)
 The purpose of this virtual visit is to provide medical care while limiting exposure to the novel coronavirus (COVID19) for both patient and office staff.   Consent was obtained for telemedvisit:  Yes.   Answered questions that patient had about telehealth interaction:  Yes.   I discussed the limitations, risks, security and privacy concerns of performing an evaluation and management service by telephone. I also discussed with the patient that there may be a patient responsible charge related to this service. The patient expressed understanding and agreed to proceed.   Patient Location: Home Provider Location: office 33 yr old with ulcerative colitis on budesonise 9mg  a day- has to start Humira- had routine Quant gold and was positive and he was referred to me in Sept 2025- I saw him and asked for CXR which he got on 11/26- not reported yet but looking at the films personally loooks fine with no infiltrate- pt wants to start rifampin Went thru all the meds he is currently taking and did DDI Buspirone  and Rifampin DDI- bispirone effect can be decreased- he has anxiety and he will discuss with his PCP regarding this He has no fever or chills, no nausea, vomiting- has some nasal congestion- no night swats weight loss      O/e he looks well No distress  Impression/recommendation Positive Quantiferon gold CXR N  start Rifampin 600mg  every day for 4 months Explained side effects-  Sent message by my chart  Ulcerative colitis on budesonide  Anxiety- buspirone , lexapro - DDI with rifampin- less effective He will talk to his PCP  HTN - losartan  hydrochlorothiazide   ADHD- on ritalin   Pt will do labs 3-4 weeks after he starts rifampin Follow up in jan  Total time spent on video call 20 min

## 2024-10-09 ENCOUNTER — Other Ambulatory Visit: Payer: Self-pay | Admitting: Family Medicine

## 2024-10-12 NOTE — Addendum Note (Signed)
 Addended by: VEVA MOTTS T on: 10/12/2024 01:17 PM   Modules accepted: Orders

## 2024-10-12 NOTE — Telephone Encounter (Signed)
 Patient states he just started the Rifampin  this week and I have scheduled him for labs

## 2024-10-25 ENCOUNTER — Encounter: Payer: Self-pay | Admitting: Family Medicine

## 2024-10-25 ENCOUNTER — Ambulatory Visit: Admitting: Family Medicine

## 2024-10-25 VITALS — BP 125/80 | HR 108 | Temp 97.2°F | Ht 70.0 in | Wt 212.4 lb

## 2024-10-25 DIAGNOSIS — F411 Generalized anxiety disorder: Secondary | ICD-10-CM

## 2024-10-25 DIAGNOSIS — F1091 Alcohol use, unspecified, in remission: Secondary | ICD-10-CM | POA: Insufficient documentation

## 2024-10-25 DIAGNOSIS — Z227 Latent tuberculosis: Secondary | ICD-10-CM

## 2024-10-25 MED ORDER — METHYLPHENIDATE HCL 10 MG PO TABS: 10.0000 mg | ORAL_TABLET | Freq: Three times a day (TID) | ORAL | 0 refills | Status: AC

## 2024-10-25 NOTE — Progress Notes (Signed)
 " Phone [phone] In person visit   Subjective:   Benjamin [name] is a 34 y.o. year old very pleasant male patient who presents for/with See problem oriented charting Chief Complaint  Patient presents with   Medication Problem    Started tuberculous preventative meds before injection treatment for UC. Concerned about  reaction with anxiety meds.     Past Medical History-  Patient Active Problem List   Diagnosis Date Noted   Ulcerative colitis (HCC) 04/07/2019    Priority: High   Factor V Leiden mutation 11/28/2016    Priority: High   Attention deficit disorder (ADD) in adult 05/25/2012    Priority: High   Alcohol use disorder in remission 10/25/2024    Priority: Medium    Hyperlipemia 12/05/2022    Priority: Medium    White coat syndrome with diagnosis of hypertension 07/31/2018    Priority: Medium    Generalized anxiety disorder 02/20/2013    Priority: Medium    Somatic dysfunction of spine, cervical 04/30/2021    Priority: 1.   Chronic neck pain 01/16/2021    Priority: 1.   Colitis 05/24/2024    Medications- reviewed and updated Current Outpatient Medications  Medication Sig Dispense Refill   budesonide (ENTOCORT EC) 3 MG 24 hr capsule Take 9 mg by mouth daily.     busPIRone  (BUSPAR ) 10 MG tablet TAKE 1 TABLET BY MOUTH TWICE A DAY 180 tablet 2   escitalopram  (LEXAPRO ) 10 MG tablet TAKE 1 AND 1/2 TABLETS BY MOUTH DAILY 135 tablet 1   hydrOXYzine  (ATARAX ) 25 MG tablet TAKE 1 AND 1/2 TABLETS BY MOUTH EVERY 8 HOURS AS NEEDED 270 tablet 1   losartan -hydrochlorothiazide  (HYZAAR) 100-25 MG tablet Take 1 tablet by mouth daily. 90 tablet 3   methocarbamol  (ROBAXIN ) 500 MG tablet Take 1 tablet (500 mg total) by mouth 4 (four) times daily. 15 tablet 0   rifampin  (RIFADIN ) 300 MG capsule Take 2 capsules (600 mg total) by mouth daily. 60 capsule 3   methylphenidate  (RITALIN ) 10 MG tablet Take 1 tablet (10 mg total) by mouth 3 (three) times daily with meals. Fill in 1  month 90 tablet 0   methylphenidate  (RITALIN ) 10 MG tablet Take 1 tablet (10 mg total) by mouth 3 (three) times daily with meals. Fill in 2 months 90 tablet 0   methylphenidate  (RITALIN ) 10 MG tablet Take 1 tablet (10 mg total) by mouth 3 (three) times daily with meals. 90 tablet 0   No current facility-administered medications for this visit.     Objective:  BP 125/80 Comment: most recent home reading  Pulse (!) 108   Temp (!) 97.2 F (36.2 C) (Temporal)   Ht 5' 10 (1.778 m)   Wt 212 lb 6.4 oz (96.3 kg)   SpO2 99%   BMI 30.48 kg/m  Gen: NAD, resting comfortably CV: RRR no murmurs rubs or gallops Lungs: CTAB no crackles, wheeze, rhonchi. Mild tachycardia (just took ritalin ) Ext: no edema Skin: warm, dry     Assessment and Plan      #social update- has gotten engaged . Traveling to [place] for honeymoon- going to [place].  - he plans to go to public health for counseling on this. When he did travel clinic was very costly.  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 Cannot drink on this - but has had some beverages- has cut back but still an issue-  about 6-7 a week down from 15-20 per week. He's still struggling to cut out completely- talked with therapist who recommended possible medicines. Also planning to start exercise regimen. He's considering AA- advised him to pursue. Joining group at [facility]. Has also eliminated THC  He's still planning on starting Humira but has to be on rifampin  for full month- on for 2 weeks. Thankfully he has not noted worsening anxiety.     05/23/2024   10:46 AM 12/05/2022   11:24 AM 02/03/2022    1:37 PM 12/13/2021    8:38 AM  GAD 7 : Generalized Anxiety Score  Nervous, Anxious, on Edge 1  1  3  3    Control/stop worrying 2  1  2  3    Worry too much - different things 2  1  2  3    Trouble relaxing 1  1  3  3    Restless 1  1  1  3     Easily annoyed or irritable 2  1  0  1   Afraid - awful might happen 2  0  1  3   Total GAD 7 Score 11 6 12 19   Anxiety Difficulty Somewhat difficult Somewhat difficult Very difficult Very difficult     Data saved with a previous flowsheet row definition   A/P: Patient with appropriate treatment for latent tuberculosis but he has concerns about interaction with his anxiety medications as well as with alcohol use disorder which thankfully is already improving  We discussed potential interaction of rifampin  and buspirone  with rifampin  substantially decreasing the benefit of buspirone .  Thankfully his anxiety has remained well-controlled with current regimen.  There is a mild effect on his escitalopram  15 mg and we discussed the option of increasing to 20 mg but since he is doing reasonably well we opted to hold steady-if he has worsening symptoms we can certainly go up on this to 20 mg.  Since the rifampin  is not a long-term treatment-we opted to leave the buspirone  on board for now even with the decrease in benefit  Alcohol use disorder- he has recognized this issue and been intentional already about cutting back. He is going to continue to work with therapist and I also strongly advised AA. We also considered campral for cravings would be 666 mg three times a day- he wants to think this over  Generalized anxiety disorder mild poor contrl but he feels some of this is from all of the changes/concerns as above- discussion today wa shelpful for him and he wants to continue current medications for now and continue to work with therapist. Remain on escitalopram  15 mg with buspirone  and hydroxyzine  as needed (he may lean more on hydroxyzine  now) - could consider psychiatry for support if we do not reach improved levels in future with AA and therapy continuation or if worening control   #White Coat Syndrome with hypertension S: medication(s): Losartan  hydrochlorothiazide  100-25 mg-previously irbesartan   and chlorthalidone  BP Readings from Last 3 Encounters:  10/25/24 125/80  06/15/24 124/89  05/26/24 (!) 124/90  A/P: blood pressure controlled at home- continue current medications   #Adult ADD S: Compliant with Ritalin  10 mg three times daily. Depression with extended release version in the past.  -Diagnosed 2011 by [facility] reviewed today - no high risk use -has stopped all THC products- need to retest UDS at physical A/P: stable control- continue current medications - refills today- once again would completely stop alcohol especially with ongoing  ritalin     Recommended follow up: Return for next already scheduled visit or sooner if needed. Future Appointments  Date Time Provider Department Center  10/31/2024  2:00 PM [facility]  12/08/2024 10:00 AM Katrinka Garnette KIDD, MD [facility]    Lab/Order associations:   ICD-10-CM   1. Alcohol use disorder in remission  F10.91     2. Generalized anxiety disorder  F41.1     3. Latent tuberculosis  Z22.7       Meds ordered this encounter  Medications   methylphenidate  (RITALIN ) 10 MG tablet    Sig: Take 1 tablet (10 mg total) by mouth 3 (three) times daily with meals. Fill in 1 month    Dispense:  90 tablet    Refill:  0   methylphenidate  (RITALIN ) 10 MG tablet    Sig: Take 1 tablet (10 mg total) by mouth 3 (three) times daily with meals. Fill in 2 months    Dispense:  90 tablet    Refill:  0   methylphenidate  (RITALIN ) 10 MG tablet    Sig: Take 1 tablet (10 mg total) by mouth 3 (three) times daily with meals.    Dispense:  90 tablet    Refill:  0    May fill today.    Return precautions advised.  Garnette Katrinka, MD  "

## 2024-10-25 NOTE — Patient Instructions (Addendum)
 We discussed potential interaction of rifampin  and buspirone  with rifampin  substantially decreasing the benefit of buspirone .  Thankfully his anxiety has remained well-controlled with current regimen.  There is a mild effect on his escitalopram  15 mg and we discussed the option of increasing to 20 mg but since he is doing reasonably well we opted to hold steady-if he has worsening symptoms we can certainly go up on this to 20 mg.  Since the rifampin  is not a long-term treatment-we opted to leave the buspirone  on board for now even with the decrease in benefit  Alcohol use disorder- he has recognized this issue and been intentional already about cutting back. He is going to continue to work with therapist and I also strongly advised AA. We also considered campral for cravings would be 666 mg three times a day- he wants to think this over  Recommended follow up: Return for next already scheduled visit or sooner if needed.

## 2024-10-29 ENCOUNTER — Encounter: Payer: Self-pay | Admitting: Family Medicine

## 2024-10-29 DIAGNOSIS — F1091 Alcohol use, unspecified, in remission: Secondary | ICD-10-CM

## 2024-10-31 ENCOUNTER — Other Ambulatory Visit: Payer: Self-pay

## 2024-10-31 ENCOUNTER — Other Ambulatory Visit

## 2024-10-31 DIAGNOSIS — F1091 Alcohol use, unspecified, in remission: Secondary | ICD-10-CM

## 2024-10-31 MED ORDER — ACAMPROSATE CALCIUM 333 MG PO TBEC: 666.0000 mg | DELAYED_RELEASE_TABLET | Freq: Three times a day (TID) | ORAL | Status: DC

## 2024-10-31 MED ORDER — ACAMPROSATE CALCIUM 333 MG PO TBEC: 666.0000 mg | DELAYED_RELEASE_TABLET | Freq: Three times a day (TID) | ORAL | 0 refills | Status: AC

## 2024-10-31 NOTE — Addendum Note (Signed)
 Addended by: CRISTOPHER TAWNI BROCKS on: 10/31/2024 09:27 AM   Modules accepted: Orders

## 2024-11-07 ENCOUNTER — Other Ambulatory Visit

## 2024-11-17 ENCOUNTER — Other Ambulatory Visit

## 2024-12-01 ENCOUNTER — Ambulatory Visit: Admitting: Family Medicine

## 2024-12-08 ENCOUNTER — Encounter: Admitting: Family Medicine
